# Patient Record
Sex: Female | Born: 1980 | Race: White | Hispanic: No | Marital: Married | Smoking: Never smoker
Health system: Southern US, Community
[De-identification: ages and names within clinical notes are randomized; demographics above are authoritative.]

## PROBLEM LIST (undated history)

## (undated) DIAGNOSIS — R112 Nausea with vomiting, unspecified: Secondary | ICD-10-CM

## (undated) DIAGNOSIS — M545 Low back pain, unspecified: Secondary | ICD-10-CM

## (undated) DIAGNOSIS — F32A Depression, unspecified: Secondary | ICD-10-CM

## (undated) DIAGNOSIS — E7800 Pure hypercholesterolemia, unspecified: Secondary | ICD-10-CM

## (undated) DIAGNOSIS — T8859XA Other complications of anesthesia, initial encounter: Secondary | ICD-10-CM

## (undated) DIAGNOSIS — I10 Essential (primary) hypertension: Secondary | ICD-10-CM

## (undated) DIAGNOSIS — R519 Headache, unspecified: Secondary | ICD-10-CM

## (undated) DIAGNOSIS — F419 Anxiety disorder, unspecified: Secondary | ICD-10-CM

## (undated) DIAGNOSIS — Z9889 Other specified postprocedural states: Secondary | ICD-10-CM

## (undated) DIAGNOSIS — K219 Gastro-esophageal reflux disease without esophagitis: Secondary | ICD-10-CM

## (undated) DIAGNOSIS — R300 Dysuria: Secondary | ICD-10-CM

## (undated) DIAGNOSIS — R079 Chest pain, unspecified: Secondary | ICD-10-CM

## (undated) HISTORY — DX: Low back pain: M54.5

## (undated) HISTORY — PX: CHOLECYSTECTOMY: SHX55

## (undated) HISTORY — DX: Low back pain, unspecified: M54.50

## (undated) HISTORY — DX: Dysuria: R30.0

## (undated) HISTORY — DX: Chest pain, unspecified: R07.9

## (undated) HISTORY — DX: Pure hypercholesterolemia, unspecified: E78.00

## (undated) HISTORY — DX: Gastro-esophageal reflux disease without esophagitis: K21.9

---

## 2008-02-07 ENCOUNTER — Ambulatory Visit: Payer: Self-pay | Admitting: Cardiology

## 2008-05-12 ENCOUNTER — Emergency Department (HOSPITAL_COMMUNITY): Admission: EM | Admit: 2008-05-12 | Discharge: 2008-05-12 | Payer: Self-pay | Admitting: Emergency Medicine

## 2017-08-18 ENCOUNTER — Encounter: Payer: Self-pay | Admitting: *Deleted

## 2017-08-19 ENCOUNTER — Encounter: Payer: Self-pay | Admitting: *Deleted

## 2017-08-19 ENCOUNTER — Encounter: Payer: Self-pay | Admitting: Cardiology

## 2017-08-19 ENCOUNTER — Ambulatory Visit (INDEPENDENT_AMBULATORY_CARE_PROVIDER_SITE_OTHER): Payer: Medicaid Other | Admitting: Cardiology

## 2017-08-19 ENCOUNTER — Telehealth: Payer: Self-pay | Admitting: Cardiology

## 2017-08-19 VITALS — BP 128/98 | HR 82 | Ht 60.0 in | Wt 249.8 lb

## 2017-08-19 DIAGNOSIS — R079 Chest pain, unspecified: Secondary | ICD-10-CM

## 2017-08-19 DIAGNOSIS — R9439 Abnormal result of other cardiovascular function study: Secondary | ICD-10-CM

## 2017-08-19 NOTE — Patient Instructions (Signed)
Your physician recommends that you schedule a follow-up appointment in: TO BE DETERMINED AFTER TESTING WITH DR  [facility]  Your physician recommends that you continue on your current medications as directed. Please refer to the Current Medication list given to you today.  Your physician has requested that you have en exercise stress myoview. For further information please visit [email or web address]. Please follow instruction sheet, as given.  Thank you for choosing [facility]!!

## 2017-08-19 NOTE — Progress Notes (Signed)
Clinical Summary Ms. Fifer is a 36 y.o.female seen as new consult, referred by [facility] for chest pain  1. Chest pain - left arm pain about 1 month. Midchest as well. Pressure pain midchest, 5/10 in severity. Occurs at rest or with exertion. Can get diaphoretic, headache. Lasts few seconds. Lasts 1-2 a day. Worst with deep breaths.  - walks 15-20 minutes, has had some recent DOE.Can have some palpitations at night.  - burninng pain constant in chest, can have some nausea  - 07/2017 GXT [facility]: exercised 5 minutes, 29mm ST depressions inferior leads in setting of hypertensive response with SBP to 215 (treadmill stopped by technician)  CAD risk factors: hyperlipidemia, HTN lifestyle controlled, father MI unsure of age    Past Medical History:  Diagnosis Date  . Chest pain   . Dysuria   . Low back pain      Allergies  Allergen Reactions  . Augmentin [Amoxicillin-Pot Clavulanate]     rash  . Cymbalta [Duloxetine Hcl]     Nausea   . Macrobid [Nitrofurantoin Macrocrystal]     Rash      Current Outpatient Prescriptions  Medication Sig Dispense Refill  . acetaminophen (TYLENOL) 500 MG tablet Take 500 mg by mouth every 6 (six) hours as needed.    . ALPRAZolam (XANAX) 0.5 MG tablet Take 0.5 mg by mouth 2 (two) times daily as needed for anxiety.    Marland Kitchen esomeprazole (NEXIUM) 40 MG capsule Take 40 mg by mouth daily at 12 noon.    Marland Kitchen ibuprofen (ADVIL,MOTRIN) 800 MG tablet Take 800 mg by mouth every 8 (eight) hours as needed.    . simvastatin (ZOCOR) 40 MG tablet Take 40 mg by mouth daily.     No current facility-administered medications for this visit.      No past surgical history on file.   Allergies  Allergen Reactions  . Augmentin [Amoxicillin-Pot Clavulanate]     rash  . Cymbalta [Duloxetine Hcl]     Nausea   . Macrobid [Nitrofurantoin Macrocrystal]     Rash       No family history on file.   Social History Ms. Eppolito reports that she has never  smoked. She has never used smokeless tobacco. Ms. Steichen has no alcohol history on file.   Review of Systems CONSTITUTIONAL: No weight loss, fever, chills, weakness or fatigue.  HEENT: Eyes: No visual loss, blurred vision, double vision or yellow sclerae.No hearing loss, sneezing, congestion, runny nose or sore throat.  SKIN: No rash or itching.  CARDIOVASCULAR: per hpi RESPIRATORY: per hpi GASTROINTESTINAL: No anorexia, nausea, vomiting or diarrhea. No abdominal pain or blood.  GENITOURINARY: No burning on urination, no polyuria NEUROLOGICAL: No headache, dizziness, syncope, paralysis, ataxia, numbness or tingling in the extremities. No change in bowel or bladder control.  MUSCULOSKELETAL: No muscle, back pain, joint pain or stiffness.  LYMPHATICS: No enlarged nodes. No history of splenectomy.  PSYCHIATRIC: No history of depression or anxiety.  ENDOCRINOLOGIC: No reports of sweating, cold or heat intolerance. No polyuria or polydipsia.  Marland Kitchen   Physical Examination Vitals:   08/19/17 1442  BP: (!) 128/98  Pulse: 82  SpO2: 98%   Vitals:   08/19/17 1442  Weight: 249 lb 12.8 oz (113.3 kg)  Height: 5' (1.524 m)    Gen: resting comfortably, no acute distress HEENT: no scleral icterus, pupils equal round and reactive, no palptable cervical adenopathy,  CV: RRR, no m/r/g, no jvd Resp: Clear to auscultation bilaterally  GI: abdomen is soft, non-tender, non-distended, normal bowel sounds, no hepatosplenomegaly MSK: extremities are warm, no edema.  Skin: warm, no rash Neuro:  no focal deficits Psych: appropriate affect    Assessment and Plan  1. Chest pain/Abnormal stress test - somewhat atypical symptoms. Recent GXT with ST depressions, unclear significance in setting of hypertensive response. We will obtain an exercise nucldear stress 2 day protocol to better evaluate and risk stratify   F/u pendings reults.       Arnoldo Lenis, M.D., F.A.C.C.

## 2017-08-19 NOTE — Telephone Encounter (Signed)
Pre-cert Verification for the following procedure   EXERCISE NUC STRESS TEST (2 DAY PROTOCOL) scheduled for 08-30-17

## 2017-08-30 ENCOUNTER — Encounter (HOSPITAL_COMMUNITY): Payer: Medicaid Other

## 2017-08-30 ENCOUNTER — Other Ambulatory Visit (HOSPITAL_COMMUNITY): Payer: Medicaid Other

## 2017-09-07 ENCOUNTER — Inpatient Hospital Stay (HOSPITAL_COMMUNITY): Admission: RE | Admit: 2017-09-07 | Payer: Medicaid Other | Source: Ambulatory Visit

## 2017-09-07 ENCOUNTER — Encounter (HOSPITAL_COMMUNITY): Payer: Medicaid Other

## 2017-09-08 ENCOUNTER — Encounter (HOSPITAL_COMMUNITY): Payer: Medicaid Other

## 2017-09-28 ENCOUNTER — Encounter (HOSPITAL_BASED_OUTPATIENT_CLINIC_OR_DEPARTMENT_OTHER)
Admission: RE | Admit: 2017-09-28 | Discharge: 2017-09-28 | Disposition: A | Discharge status: Home / Self Care | Payer: Medicaid Other | Source: Ambulatory Visit | Attending: Cardiology | Admitting: Cardiology

## 2017-09-28 ENCOUNTER — Encounter (HOSPITAL_COMMUNITY)
Admission: RE | Admit: 2017-09-28 | Discharge: 2017-09-28 | Disposition: A | Discharge status: Home / Self Care | Payer: Medicaid Other | Source: Ambulatory Visit | Attending: Cardiology | Admitting: Cardiology

## 2017-09-28 ENCOUNTER — Encounter (HOSPITAL_COMMUNITY): Payer: Self-pay

## 2017-09-28 ENCOUNTER — Encounter (HOSPITAL_COMMUNITY): Payer: Medicaid Other

## 2017-09-28 DIAGNOSIS — R079 Chest pain, unspecified: Secondary | ICD-10-CM

## 2017-09-28 HISTORY — DX: Essential (primary) hypertension: I10

## 2017-09-28 MED ORDER — TECHNETIUM TC 99M TETROFOSMIN IV KIT
30.0000 | PACK | Freq: Once | INTRAVENOUS | Status: AC | PRN
  Administered 2017-09-28: 32 via INTRAVENOUS

## 2017-09-28 MED ORDER — SODIUM CHLORIDE 0.9% FLUSH
INTRAVENOUS | Status: AC
  Administered 2017-09-28: 10 mL via INTRAVENOUS
  Filled 2017-09-28: qty 10

## 2017-09-28 MED ORDER — REGADENOSON 0.4 MG/5ML IV SOLN
INTRAVENOUS | Status: AC
  Filled 2017-09-28: qty 5

## 2017-09-29 ENCOUNTER — Encounter (HOSPITAL_COMMUNITY): Payer: Self-pay

## 2017-09-29 ENCOUNTER — Encounter (HOSPITAL_COMMUNITY)
Admission: RE | Admit: 2017-09-29 | Discharge: 2017-09-29 | Disposition: A | Discharge status: Home / Self Care | Payer: Medicaid Other | Source: Ambulatory Visit | Attending: Cardiology | Admitting: Cardiology

## 2017-09-29 LAB — NM MYOCAR MULTI W/SPECT W/WALL MOTION / EF
CHL CUP MPHR: 182 {beats}/min
CHL CUP NUCLEAR SDS: 2
CHL CUP RESTING HR STRESS: 74 {beats}/min
CHL RATE OF PERCEIVED EXERTION: 15
CSEPEDS: 42 s
Estimated workload: 7 METS
Exercise duration (min): 4 min
LHR: 0.43
LVDIAVOL: 61 mL (ref 46–106)
LVSYSVOL: 23 mL
NUC STRESS TID: 0.72
Peak HR: 164 {beats}/min
Percent HR: 90 %
SRS: 1
SSS: 3

## 2017-09-29 MED ORDER — TECHNETIUM TC 99M TETROFOSMIN IV KIT
25.0000 | PACK | Freq: Once | INTRAVENOUS | Status: AC | PRN
  Administered 2017-09-29: 26 via INTRAVENOUS

## 2017-09-30 ENCOUNTER — Telehealth: Payer: Self-pay | Admitting: *Deleted

## 2017-09-30 NOTE — Telephone Encounter (Signed)
-----   Message from Arnoldo Lenis, MD sent at 09/29/2017  4:04 PM EST ----- Stress test looks good, no evidence of any blockages. Should f/u with pcp to discuss noncardiac causes of symptoms. Can f/u with [name] in 4 months   Zandra Abts MD

## 2017-09-30 NOTE — Telephone Encounter (Signed)
Pt aware and voiced understanding - routed to pcp - 4 month recall placed

## 2017-10-29 ENCOUNTER — Encounter (INDEPENDENT_AMBULATORY_CARE_PROVIDER_SITE_OTHER): Payer: Self-pay | Admitting: Internal Medicine

## 2017-10-29 ENCOUNTER — Encounter (INDEPENDENT_AMBULATORY_CARE_PROVIDER_SITE_OTHER): Payer: Self-pay

## 2017-11-11 ENCOUNTER — Ambulatory Visit (INDEPENDENT_AMBULATORY_CARE_PROVIDER_SITE_OTHER): Payer: Medicaid Other | Admitting: Internal Medicine

## 2017-11-11 ENCOUNTER — Encounter (INDEPENDENT_AMBULATORY_CARE_PROVIDER_SITE_OTHER): Payer: Self-pay | Admitting: Internal Medicine

## 2018-12-04 IMAGING — NM NM MYOCAR MULTI W/SPECT W/WALL MOTION & EF
2 series · 12 of 12 positions shown · non-contrast
Comparison: none

[Series 1: stress gated · 6.51mm/px · 6 of 64 frames shown]
[frame 6/64]
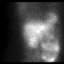
[frame 16/64]
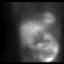
[frame 27/64]
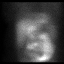
[frame 38/64]
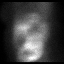
[frame 48/64]
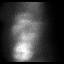
[frame 59/64]
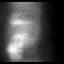

[Series 2: rest · 6.51mm/px · 6 of 64 frames shown]
[frame 6/64]
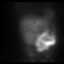
[frame 16/64]
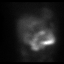
[frame 27/64]
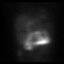
[frame 38/64]
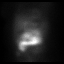
[frame 48/64]
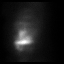
[frame 59/64]
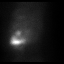

[12 of 12 positions shown; findings below may reference images not displayed]

Canned report from images found in remote index.

Refer to host system for actual result text.

## 2021-11-06 ENCOUNTER — Encounter: Payer: Medicaid Other | Admitting: Gynecologic Oncology

## 2021-11-14 DIAGNOSIS — Z6841 Body Mass Index (BMI) 40.0 and over, adult: Secondary | ICD-10-CM | POA: Diagnosis not present

## 2021-11-14 DIAGNOSIS — J019 Acute sinusitis, unspecified: Secondary | ICD-10-CM | POA: Diagnosis not present

## 2021-11-14 DIAGNOSIS — R350 Frequency of micturition: Secondary | ICD-10-CM | POA: Diagnosis not present

## 2021-11-14 DIAGNOSIS — Z20828 Contact with and (suspected) exposure to other viral communicable diseases: Secondary | ICD-10-CM | POA: Diagnosis not present

## 2021-11-17 DIAGNOSIS — J209 Acute bronchitis, unspecified: Secondary | ICD-10-CM | POA: Diagnosis not present

## 2021-11-17 DIAGNOSIS — J019 Acute sinusitis, unspecified: Secondary | ICD-10-CM | POA: Diagnosis not present

## 2021-11-17 DIAGNOSIS — N644 Mastodynia: Secondary | ICD-10-CM | POA: Diagnosis not present

## 2021-11-17 DIAGNOSIS — F419 Anxiety disorder, unspecified: Secondary | ICD-10-CM | POA: Diagnosis not present

## 2021-11-17 DIAGNOSIS — M545 Low back pain, unspecified: Secondary | ICD-10-CM | POA: Diagnosis not present

## 2021-11-17 DIAGNOSIS — R69 Illness, unspecified: Secondary | ICD-10-CM | POA: Diagnosis not present

## 2021-11-17 DIAGNOSIS — E7849 Other hyperlipidemia: Secondary | ICD-10-CM | POA: Diagnosis not present

## 2021-11-17 DIAGNOSIS — I10 Essential (primary) hypertension: Secondary | ICD-10-CM | POA: Diagnosis not present

## 2021-11-26 DIAGNOSIS — N644 Mastodynia: Secondary | ICD-10-CM | POA: Diagnosis not present

## 2021-11-26 DIAGNOSIS — R922 Inconclusive mammogram: Secondary | ICD-10-CM | POA: Diagnosis not present

## 2021-12-01 ENCOUNTER — Encounter: Payer: Self-pay | Admitting: Adult Health

## 2021-12-01 ENCOUNTER — Other Ambulatory Visit (HOSPITAL_COMMUNITY)
Admission: RE | Admit: 2021-12-01 | Discharge: 2021-12-01 | Disposition: A | Discharge status: Home / Self Care | Payer: 59 | Source: Ambulatory Visit | Attending: Adult Health | Admitting: Adult Health

## 2021-12-01 ENCOUNTER — Other Ambulatory Visit: Payer: Self-pay

## 2021-12-01 ENCOUNTER — Ambulatory Visit (INDEPENDENT_AMBULATORY_CARE_PROVIDER_SITE_OTHER): Payer: 59 | Admitting: Adult Health

## 2021-12-01 VITALS — BP 155/108 | HR 67 | Ht 60.0 in | Wt 264.0 lb

## 2021-12-01 DIAGNOSIS — N921 Excessive and frequent menstruation with irregular cycle: Secondary | ICD-10-CM

## 2021-12-01 DIAGNOSIS — R232 Flushing: Secondary | ICD-10-CM

## 2021-12-01 DIAGNOSIS — Z124 Encounter for screening for malignant neoplasm of cervix: Secondary | ICD-10-CM

## 2021-12-01 DIAGNOSIS — R4589 Other symptoms and signs involving emotional state: Secondary | ICD-10-CM | POA: Diagnosis not present

## 2021-12-01 DIAGNOSIS — R69 Illness, unspecified: Secondary | ICD-10-CM | POA: Diagnosis not present

## 2021-12-01 DIAGNOSIS — N946 Dysmenorrhea, unspecified: Secondary | ICD-10-CM

## 2021-12-01 MED ORDER — MEGESTROL ACETATE 40 MG PO TABS: ORAL_TABLET | ORAL | 1 refills | Status: DC

## 2021-12-01 NOTE — Progress Notes (Signed)
Patient ID: Michele Wu, female   DOB: 1981/03/10, 41 y.o.   MRN: [id number] History of Present Illness: Michele Wu is a 41 year old white female,married, [id number], in complaining of heavy bleeding for 1 week, and had period 2 weeks in December, but skipped October and November. She has had clots and cramps. She needs pap. She is a new pt. PCP is Dr Quillian Quince.   Current Medications, Allergies, Past Medical History, Past Surgical History, Family History and Social History were reviewed in [employer] record.     Review of Systems: Heavy bleeding x 1 week Had period 2 weeks in December, but skipped October and November Reviewed past medical,surgical, social and family history. Reviewed medications and allergies.     Physical Exam:BP (!) 155/108 (BP Location: Right Arm, Patient Position: Sitting, Cuff Size: Normal)    Pulse 67    Ht 5' (1.524 m)    Wt 264 lb (119.7 kg)    LMP 11/24/2021    BMI 51.56 kg/m   General:  Well developed, well nourished, no acute distress Skin:  Warm and dry Neck:  Midline trachea, normal thyroid, good ROM, no lymphadenopathy Lungs; Clear to auscultation bilaterally Cardiovascular: Regular rate and rhythm Pelvic:  External genitalia is normal in appearance, no lesions.  The vagina is normal in appearance.+blood. Urethra has no lesions or masses. The cervix is bulbous.Pap with HR HPV genotyping performed.  Uterus is felt to be slightly enlarged.  No adnexal masses or tenderness noted.Bladder is non tender, no masses felt. Extremities/musculoskeletal:  No swelling or varicosities noted, no clubbing or cyanosis Psych:  No mood changes, alert and cooperative,seems happy AA is 0 Fall risk is low Depression screen PHQ 2/9 12/01/2021  Decreased Interest 0  Down, Depressed, Hopeless 0  PHQ - 2 Score 0    GAD 7 : Generalized Anxiety Score 12/01/2021  Nervous, Anxious, on Edge 3  Control/stop worrying 3  Worry too much - different things 3   Trouble relaxing 3  Restless 3  Easily annoyed or irritable 3  Afraid - awful might happen 3  Total GAD 7 Score 21  Takes xanax as needed for anxiety per PCP   Upstream - 12/01/21 0954       Pregnancy Intention Screening   Does the patient want to become pregnant in the next year? No    Does the patient's partner want to become pregnant in the next year? No    Would the patient like to discuss contraceptive options today? No      Contraception Wrap Up   Current Method Female Sterilization    End Method Female Sterilization    Contraception Counseling Provided No            Examination chaperoned by Celene Squibb LPN    Impression and Plan: 1. Menorrhagia with irregular cycle Will get pelvic US 12/04/21 at [facility] at 12:30 pm to assess uterus and ovaries Will check labs  Will rx megace to try to stop bleeding Meds ordered this encounter  Medications   megestrol (MEGACE) 40 MG tablet    Sig: Take 3 x 5 days when 2 x 5 days then 1 daily    Dispense:  45 tablet    Refill:  1    Order Specific Question:   Supervising Provider    Answer:   Tania Ade H [[id number]]    - US PELVIC COMPLETE WITH TRANSVAGINAL; Future - CBC - TSH - Follicle stimulating hormone Follow up with  me in 1 week to review [name] and ROS  2. Dysmenorrhea - US PELVIC COMPLETE WITH TRANSVAGINAL; Future  3. Moody  4. Hot flashes Will check labs  - Comprehensive metabolic panel - TSH - Follicle stimulating hormone Will call her with labs results   5. Routine Papanicolaou smear Pap sent HR HPV genotyping sent  - Cytology - PAP( [place])   To to [facility] about [facility]

## 2021-12-02 LAB — COMPREHENSIVE METABOLIC PANEL
ALT: 25 IU/L (ref 0–32)
AST: 22 IU/L (ref 0–40)
Albumin/Globulin Ratio: 1.4 (ref 1.2–2.2)
Albumin: 4.4 g/dL (ref 3.8–4.8)
Alkaline Phosphatase: 89 IU/L (ref 44–121)
BUN/Creatinine Ratio: 21 (ref 9–23)
BUN: 18 mg/dL (ref 6–24)
Bilirubin Total: 0.3 mg/dL (ref 0.0–1.2)
CO2: 23 mmol/L (ref 20–29)
Calcium: 9.3 mg/dL (ref 8.7–10.2)
Chloride: 104 mmol/L (ref 96–106)
Creatinine, Ser: 0.84 mg/dL (ref 0.57–1.00)
Globulin, Total: 3.1 g/dL (ref 1.5–4.5)
Glucose: 96 mg/dL (ref 70–99)
Potassium: 4.5 mmol/L (ref 3.5–5.2)
Sodium: 141 mmol/L (ref 134–144)
Total Protein: 7.5 g/dL (ref 6.0–8.5)
eGFR: 89 mL/min/{1.73_m2} (ref >59)

## 2021-12-02 LAB — CBC
Hematocrit: 39.5 % (ref 34.0–46.6)
Hemoglobin: 13.3 g/dL (ref 11.1–15.9)
MCH: 30.6 pg (ref 26.6–33.0)
MCHC: 33.7 g/dL (ref 31.5–35.7)
MCV: 91 fL (ref 79–97)
Platelets: 325 10*3/uL (ref 150–450)
RBC: 4.34 x10E6/uL (ref 3.77–5.28)
RDW: 13 % (ref 11.7–15.4)
WBC: 8 10*3/uL (ref 3.4–10.8)

## 2021-12-02 LAB — TSH: TSH: 2.1 u[IU]/mL (ref 0.450–4.500)

## 2021-12-02 LAB — FOLLICLE STIMULATING HORMONE: FSH: 11.6 m[IU]/mL

## 2021-12-03 LAB — CYTOLOGY - PAP
Comment: NEGATIVE
High risk HPV: NEGATIVE

## 2021-12-05 ENCOUNTER — Ambulatory Visit (HOSPITAL_COMMUNITY)
Admission: RE | Admit: 2021-12-05 | Discharge: 2021-12-05 | Disposition: A | Discharge status: Home / Self Care | Payer: 59 | Source: Ambulatory Visit | Attending: Adult Health | Admitting: Adult Health

## 2021-12-05 ENCOUNTER — Other Ambulatory Visit: Payer: Self-pay

## 2021-12-05 DIAGNOSIS — N921 Excessive and frequent menstruation with irregular cycle: Secondary | ICD-10-CM | POA: Insufficient documentation

## 2021-12-05 DIAGNOSIS — N946 Dysmenorrhea, unspecified: Secondary | ICD-10-CM | POA: Diagnosis present

## 2021-12-08 ENCOUNTER — Ambulatory Visit (INDEPENDENT_AMBULATORY_CARE_PROVIDER_SITE_OTHER): Payer: 59 | Admitting: Adult Health

## 2021-12-08 ENCOUNTER — Encounter: Payer: Self-pay | Admitting: Adult Health

## 2021-12-08 ENCOUNTER — Other Ambulatory Visit: Payer: Self-pay

## 2021-12-08 VITALS — BP 151/102 | HR 77 | Ht 61.0 in | Wt 267.0 lb

## 2021-12-08 DIAGNOSIS — R1031 Right lower quadrant pain: Secondary | ICD-10-CM

## 2021-12-08 DIAGNOSIS — N83202 Unspecified ovarian cyst, left side: Secondary | ICD-10-CM

## 2021-12-08 DIAGNOSIS — N83291 Other ovarian cyst, right side: Secondary | ICD-10-CM | POA: Diagnosis not present

## 2021-12-08 DIAGNOSIS — N921 Excessive and frequent menstruation with irregular cycle: Secondary | ICD-10-CM

## 2021-12-08 MED ORDER — MEGESTROL ACETATE 40 MG PO TABS: ORAL_TABLET | ORAL | 3 refills | Status: DC

## 2021-12-08 NOTE — Progress Notes (Signed)
°  Subjective:     Patient ID: Michele Wu, female   DOB: August 23, 1981, 41 y.o.   MRN: [id number]  HPI Michele Wu is a 41 year old white female, married, [id number], back in follow up on taking megace to stop heavy bleeding and having pelvic US and labs. Bleeding has stopped, still has pain in right side, labs all normal.US showed: .Unremarkable uterus and endometrial complex.   Complicated cyst of the LEFT ovary 3.3 cm greatest size containing multiple thin septations; surgical evaluation recommended.   In addition, complicated cyst in the RIGHT adnexa either of ovarian or para-ovarian origin, 4.2 cm greatest size and containing an avascular mural nodule; either MR imaging with and without contrast or surgical evaluation recommended.  Lab Results  Component Value Date   DIAGPAP - Atypical glandular cells, NOS (A) 12/01/2021   HPVHIGH Negative 12/01/2021    PCP is Dr Quillian Quince.  Review of Systems Bleeding has stopped Has pain in right side Reviewed past medical,surgical, social and family history. Reviewed medications and allergies.     Objective:   Physical Exam BP (!) 151/102 (BP Location: Right Arm, Patient Position: Sitting, Cuff Size: Large)    Pulse 77    Ht 5\' 1"  (1.549 m)    Wt 267 lb (121.1 kg)    LMP 11/24/2021    BMI 50.45 kg/m   was bad with phone company this morning, did take meds.for BP.   Skin warm and dry.Lungs: clear to ausculation bilaterally. Cardiovascular: regular rate and rhythm.   Upstream - 12/08/21 1110       Pregnancy Intention Screening   Does the patient want to become pregnant in the next year? No    Does the patient's partner want to become pregnant in the next year? No    Would the patient like to discuss contraceptive options today? No      Contraception Wrap Up   Current Method Female Sterilization    End Method Female Sterilization    Contraception Counseling Provided No             Assessment:     1. Menorrhagia with irregular  cycle Will continue megace 1 daily Meds ordered this encounter  Medications   megestrol (MEGACE) 40 MG tablet    Sig: Take 1 daily    Dispense:  30 tablet    Refill:  3    Order Specific Question:   Supervising Provider    Answer:   Elonda Husky, LUTHER H [[id number]]     2. RLQ abdominal pain Will get appt with Dr  Nelda Marseille to discuss   3. Cyst of left ovary Can follow for now  4. Complex cyst of right ovary Will get appt with Dr Nelda Marseille for possible surgical removal     Plan:     Will get appt with Dr Nelda Marseille about 12/19/21 to discuss options  Will repeat pap in 1 year, was bleeding when performed.

## 2021-12-19 ENCOUNTER — Ambulatory Visit: Payer: 59 | Admitting: Obstetrics & Gynecology

## 2021-12-26 DIAGNOSIS — N390 Urinary tract infection, site not specified: Secondary | ICD-10-CM | POA: Diagnosis not present

## 2021-12-26 DIAGNOSIS — R300 Dysuria: Secondary | ICD-10-CM | POA: Diagnosis not present

## 2022-01-14 ENCOUNTER — Ambulatory Visit (INDEPENDENT_AMBULATORY_CARE_PROVIDER_SITE_OTHER): Payer: 59 | Admitting: Obstetrics & Gynecology

## 2022-01-14 ENCOUNTER — Encounter: Payer: Self-pay | Admitting: Obstetrics & Gynecology

## 2022-01-14 ENCOUNTER — Other Ambulatory Visit: Payer: Self-pay

## 2022-01-14 VITALS — BP 156/99 | HR 73 | Ht 60.0 in | Wt 269.0 lb

## 2022-01-14 DIAGNOSIS — Z98891 History of uterine scar from previous surgery: Secondary | ICD-10-CM | POA: Diagnosis not present

## 2022-01-14 DIAGNOSIS — I10 Essential (primary) hypertension: Secondary | ICD-10-CM

## 2022-01-14 DIAGNOSIS — Z6841 Body Mass Index (BMI) 40.0 and over, adult: Secondary | ICD-10-CM

## 2022-01-14 DIAGNOSIS — N939 Abnormal uterine and vaginal bleeding, unspecified: Secondary | ICD-10-CM | POA: Diagnosis not present

## 2022-01-14 MED ORDER — MEGESTROL ACETATE 40 MG PO TABS: ORAL_TABLET | ORAL | 3 refills | Status: DC

## 2022-01-14 NOTE — Progress Notes (Signed)
? ?  GYN VISIT ?Patient name: Michele Wu MRN [id number]  Date of birth: [birth year] ?Chief Complaint:   ?Follow-up ? ?History of Present Illness:   ?Michele Wu is a 41 y.o. [phone]  female being seen today for the following concerns: ? ?AUB: Issues with menses since December.  Sometimes she would skip several months, other times monthly. Menses have also lasted longer- now about 2 weeks.  Now using a pull-up due to the heaviness about 3-4x per day.  Starting on Megace- took 3 x 5 days then 2 x 5 days then daily.  Currently on daily and that is not working,she states she has been bleeding x 2weeks.  Previously when on Megace 2 or 3 tabs the bleeding did stop. ? ?[name]- 12/05/21: 7.9cm uterus, 1.2IN left complicated cyst with thin septation.  Right ovary with 4.2cm para-ovarian cyst with mural nodule.    ? ?She has noted intermittent pelvic pain.  Denies acute pain currently. ? ?Patient's last menstrual period was 01/05/2022 (exact date). ? ? ?  12/01/2021  ?  9:53 AM  ?Depression screen PHQ 2/9  ?Decreased Interest 0  ?Down, Depressed, Hopeless 0  ?PHQ - 2 Score 0  ? ? ? ?Review of Systems:   ?Pertinent items are noted in HPI ?Denies fever/chills, dizziness, headaches, visual disturbances, fatigue, shortness of breath, chest pain, abdominal pain, vomiting,denies problems with bowel movements, urination, or intercourse unless otherwise stated above.  ?Pertinent History Reviewed:  ?Reviewed past medical,surgical, social, obstetrical and family history.  ?Reviewed problem list, medications and allergies. ?Physical Assessment:  ? ?Vitals:  ? 01/14/22 1523  ?BP: (!) 156/99  ?Pulse: 73  ?Weight: 269 lb (122 kg)  ?Height: 5' (1.524 m)  ?Body mass index is 52.54 kg/m?. ? ?     Physical Examination:  ? General appearance: alert, well appearing, and in no distress ? Psych: mood appropriate, normal affect ? Skin: warm & dry  ? Cardiovascular: normal heart rate noted ? Respiratory: normal respiratory effort, no  distress ? Abdomen: obese, soft, non-tender, uterus below umbilicus ? Pelvic: VULVA: normal appearing vulva with no masses, tenderness or lesions, VAGINA: normal appearing vagina with normal color and discharge, no lesions, CERVIX: normal appearing cervix without discharge or lesions- no bleeding appreciated UTERUS: uterus is normal size, shape, consistency and nontender- minimal mobility with tenaculum- difficult to evaluate on bimanual exam ? Extremities: no calf tenderness bilaterally ? ?Chaperone: Glenard Haring Neas   ? ?Assessment & Plan:  ?1) Abnormal uterine bleeding ?-due to cHTN, pt not a candidate for estrogen therapy ?-reviewed management options from medical to surgical intervention ?-pt sick of bleeding and would prefer surgical intervention- leaning towards hysterectomy ?-reviewed routes of hysterectomy- pending availability may consider LAVH in [place] or potential mini-lap hysterectomy ?-reviewed risk/benefits including but not limited to risk of bleeding, infection and injury to surrounding organs. Questions and concerns were addressed ? ?2) Ovarian cyst ?-discussed US findings, overall suspect benign appearance ?-plan for repeat US in 6wks for re-evaluation ?-reviewed potential for cystectomy or possible oophorectomy at the time of hysterectomy ?-currently pt asymptomatic ? ?Meds ordered this encounter  ?Medications  ? megestrol (MEGACE) 40 MG tablet  ?  Sig: Take 2 tablets daily  ?  Dispense:  60 tablet  ?  Refill:  3  ?  ? ? ?Return for pelvic US mid April (our office). ? ? ?Janyth Pupa, DO ?Attending [place], Faculty Practice ?[facility] for [place] ? ? ? ?

## 2022-01-26 DIAGNOSIS — L0291 Cutaneous abscess, unspecified: Secondary | ICD-10-CM | POA: Diagnosis not present

## 2022-01-26 DIAGNOSIS — I10 Essential (primary) hypertension: Secondary | ICD-10-CM | POA: Diagnosis not present

## 2022-01-26 DIAGNOSIS — Z6841 Body Mass Index (BMI) 40.0 and over, adult: Secondary | ICD-10-CM | POA: Diagnosis not present

## 2022-01-26 DIAGNOSIS — R5383 Other fatigue: Secondary | ICD-10-CM | POA: Diagnosis not present

## 2022-01-26 DIAGNOSIS — R300 Dysuria: Secondary | ICD-10-CM | POA: Diagnosis not present

## 2022-01-26 DIAGNOSIS — L0390 Cellulitis, unspecified: Secondary | ICD-10-CM | POA: Diagnosis not present

## 2022-01-26 DIAGNOSIS — E785 Hyperlipidemia, unspecified: Secondary | ICD-10-CM | POA: Diagnosis not present

## 2022-01-29 DIAGNOSIS — L0291 Cutaneous abscess, unspecified: Secondary | ICD-10-CM | POA: Diagnosis not present

## 2022-01-29 DIAGNOSIS — L0390 Cellulitis, unspecified: Secondary | ICD-10-CM | POA: Diagnosis not present

## 2022-01-29 DIAGNOSIS — Z6841 Body Mass Index (BMI) 40.0 and over, adult: Secondary | ICD-10-CM | POA: Diagnosis not present

## 2022-02-05 ENCOUNTER — Telehealth: Payer: Self-pay | Admitting: Obstetrics & Gynecology

## 2022-02-05 NOTE — Telephone Encounter (Signed)
Called patient to discuss next step regarding surgical intervention ? ?

## 2022-02-06 ENCOUNTER — Other Ambulatory Visit: Payer: Self-pay | Admitting: Obstetrics & Gynecology

## 2022-02-06 DIAGNOSIS — N83201 Unspecified ovarian cyst, right side: Secondary | ICD-10-CM

## 2022-02-09 ENCOUNTER — Ambulatory Visit (INDEPENDENT_AMBULATORY_CARE_PROVIDER_SITE_OTHER): Payer: 59

## 2022-02-09 DIAGNOSIS — N83202 Unspecified ovarian cyst, left side: Secondary | ICD-10-CM | POA: Diagnosis not present

## 2022-02-09 DIAGNOSIS — N83201 Unspecified ovarian cyst, right side: Secondary | ICD-10-CM | POA: Diagnosis not present

## 2022-02-09 NOTE — Progress Notes (Signed)
PELVIC US TA/TV: heterogeneous anteverted uterus with multiple fibroids and linear striations (#1) posterior fundal right intramural fibroid 2.6 x 2.5 x 1.9 cm,(#2) posterior mid submucosal vs intramural fibroid 2.5 x 1.6 x 1.7 cm,EEC 4.1 mm,endometrium appears slightly distorted by the posterior mid fibroid,septated complex left ovarian cyst 2.3 x 1.8 x 1.1,normal right ovary,4.1 x 3.1 x 3.8  complex right adnexal cyst with a nonvascular nodule 1 x .6 cm,no free fluid,pelvic pain during ultrasound,ovaries appear mobile  ? ?Chaperone Estill Bamberg ?

## 2022-02-13 ENCOUNTER — Other Ambulatory Visit: Payer: 59

## 2022-02-13 ENCOUNTER — Ambulatory Visit: Payer: 59 | Admitting: Obstetrics & Gynecology

## 2022-02-16 DIAGNOSIS — K219 Gastro-esophageal reflux disease without esophagitis: Secondary | ICD-10-CM | POA: Diagnosis not present

## 2022-02-16 DIAGNOSIS — R69 Illness, unspecified: Secondary | ICD-10-CM | POA: Diagnosis not present

## 2022-02-16 DIAGNOSIS — R350 Frequency of micturition: Secondary | ICD-10-CM | POA: Diagnosis not present

## 2022-02-16 DIAGNOSIS — M545 Low back pain, unspecified: Secondary | ICD-10-CM | POA: Diagnosis not present

## 2022-02-16 DIAGNOSIS — Z6841 Body Mass Index (BMI) 40.0 and over, adult: Secondary | ICD-10-CM | POA: Diagnosis not present

## 2022-02-16 DIAGNOSIS — E7849 Other hyperlipidemia: Secondary | ICD-10-CM | POA: Diagnosis not present

## 2022-02-16 DIAGNOSIS — F419 Anxiety disorder, unspecified: Secondary | ICD-10-CM | POA: Diagnosis not present

## 2022-02-16 DIAGNOSIS — I10 Essential (primary) hypertension: Secondary | ICD-10-CM | POA: Diagnosis not present

## 2022-02-23 ENCOUNTER — Other Ambulatory Visit: Payer: 59

## 2022-02-23 ENCOUNTER — Encounter: Payer: Self-pay | Admitting: Obstetrics & Gynecology

## 2022-02-23 ENCOUNTER — Ambulatory Visit (INDEPENDENT_AMBULATORY_CARE_PROVIDER_SITE_OTHER): Payer: 59 | Admitting: Obstetrics & Gynecology

## 2022-02-23 VITALS — BP 157/95 | HR 66 | Ht 60.0 in | Wt 270.0 lb

## 2022-02-23 DIAGNOSIS — N921 Excessive and frequent menstruation with irregular cycle: Secondary | ICD-10-CM

## 2022-02-23 DIAGNOSIS — Z98891 History of uterine scar from previous surgery: Secondary | ICD-10-CM | POA: Diagnosis not present

## 2022-02-23 DIAGNOSIS — Z6841 Body Mass Index (BMI) 40.0 and over, adult: Secondary | ICD-10-CM

## 2022-02-23 DIAGNOSIS — N83291 Other ovarian cyst, right side: Secondary | ICD-10-CM | POA: Diagnosis not present

## 2022-02-23 DIAGNOSIS — N83202 Unspecified ovarian cyst, left side: Secondary | ICD-10-CM | POA: Diagnosis not present

## 2022-02-23 DIAGNOSIS — N83201 Unspecified ovarian cyst, right side: Secondary | ICD-10-CM | POA: Diagnosis not present

## 2022-02-23 NOTE — Progress Notes (Signed)
? ?GYN VISIT ?Patient name: Michele Wu MRN [id number]  Date of birth: [birth year] ?Chief Complaint:   ?Pre-op Exam ? ?History of Present Illness:   ?Michele Wu is a 41 y.o. G61P2012  female being seen today for follow up regarding recent US:    ? ?In review, pt has been struggling with AUB and lower pelvic pain.  As part of her work up, a pelvic US was completed.   ? ?Constant lower pelvic pain that feels like a UTI; however, UA sample negative.  This has been an ongoing issue for over a year.  Discussed with patient that pain may or may not be gynecologic in nature. ? ?Prior C-section x 2 ? ?AUB: In review, she has had issues with menses since December.  Sometimes she would skip several months, other times monthly. Menses have also lasted longer- sometimes about 2 weeks.  Requried a pull-up due to the heaviness about 3-4x per day.  Currently taking 3 tabs of Megace with resolution of her bleeding.  She has tried to stop; however, bleeding has returned.  Plans to continue with Megace until surgery can be scheduled. ? ?[name] report: ? ?02/09/22: 5.8cm uterus with multiple fibroids and linear striations (#1) posterior fundal right intramural fibroid 2.6 x 2.5 x 1.9 cm,(#2) posterior mid submucosal vs intramural fibroid 2.5 x 1.6 x 1.7 cm ? ?Ovaries: ?septated complex left ovarian cyst 2.3 x 1.8 x 1.1,normal right ovary,4.1 x 3.1 x 3.8  complex right adnexal cyst with a nonvascular nodule 1 x .6 cm,no free fluid,pelvic pain during ultrasound,ovaries appear mobile  ? ?[name]- 12/05/21: 7.9cm uterus, 3.3LK left complicated cyst with thin septation.  Right ovary with 4.2cm para-ovarian cyst with mural nodule.    ? ?No LMP recorded. Patient is perimenopausal. ? ? ?  12/01/2021  ?  9:53 AM  ?Depression screen PHQ 2/9  ?Decreased Interest 0  ?Down, Depressed, Hopeless 0  ?PHQ - 2 Score 0  ? ? ? ?Review of Systems:   ?Pertinent items are noted in HPI ?Denies fever/chills, dizziness, headaches, visual disturbances, fatigue,  shortness of breath, chest pain, vomiting unless otherwise stated above.  ?Pertinent History Reviewed:  ?Reviewed past medical,surgical, social, obstetrical and family history.  ?OBhx: C-section x 2 ?Reviewed problem list, medications and allergies. ?Physical Assessment:  ? ?Vitals:  ? 02/23/22 1518  ?BP: (!) 157/95  ?Pulse: 66  ?Weight: 270 lb (122.5 kg)  ?Height: 5' (1.524 m)  ?Body mass index is 52.73 kg/m?. ? ?     Physical Examination:  ? General appearance: alert, well appearing, and in no distress ? Psych: mood appropriate, normal affect ? Skin: warm & dry  ? Cardiovascular: normal heart rate noted ? Respiratory: normal respiratory effort, no distress ? Abdomen: obese, soft, +RLQ tenderness ? Pelvic: examination not indicated ? Extremities: no edema  ? ?Chaperone: N/A   ? ?Assessment & Plan:  ?1) Bilateral ovarian cyst ?-reviewed [name] results ?-discussed plan for ROMA testing today to discuss next surgical step ?-due to prior c-sections, vaginal surgery not ideal ?-If ROMA low risk- would plan for total abdominal hysterectomy, bilateral salpingectomy and right cyst removal possible oophorectomy ?-discussed surgical management and ideally plan for ovarian preservation if possible ?-Reviewed risk/benefits and will plan to schedule at next available ?-Questions and concerns were addressed ?-Plan to call patient once ROMA results have returned ? ?2) AUB ?-for now plan to continue with Megace until surgical intervention ? ? ?Orders Placed This Encounter  ?Procedures  ? Ovarian Malignancy  Risk-ROMA  ? ? ?Return for to be determined. ? ? ?Janyth Pupa, DO ?Attending [place], Faculty Practice ?[facility] [place] ? ? ? ?

## 2022-02-24 LAB — OVARIAN MALIGNANCY RISK-ROMA
Cancer Antigen (CA) 125: 30.7 U/mL (ref 0.0–38.1)
HE4: 53.4 pmol/L (ref 0.0–63.6)
Postmenopausal ROMA: 1.91
Premenopausal ROMA: 0.9

## 2022-02-24 LAB — PREMENOPAUSAL INTERP: LOW

## 2022-02-24 LAB — POSTMENOPAUSAL INTERP: LOW

## 2022-03-03 DIAGNOSIS — Z20828 Contact with and (suspected) exposure to other viral communicable diseases: Secondary | ICD-10-CM | POA: Diagnosis not present

## 2022-03-03 DIAGNOSIS — J019 Acute sinusitis, unspecified: Secondary | ICD-10-CM | POA: Diagnosis not present

## 2022-03-03 DIAGNOSIS — R059 Cough, unspecified: Secondary | ICD-10-CM | POA: Diagnosis not present

## 2022-03-03 DIAGNOSIS — I10 Essential (primary) hypertension: Secondary | ICD-10-CM | POA: Diagnosis not present

## 2022-03-26 ENCOUNTER — Encounter: Payer: Self-pay | Admitting: Obstetrics & Gynecology

## 2022-04-01 ENCOUNTER — Ambulatory Visit (INDEPENDENT_AMBULATORY_CARE_PROVIDER_SITE_OTHER): Payer: 59 | Admitting: Obstetrics & Gynecology

## 2022-04-01 ENCOUNTER — Encounter: Payer: Self-pay | Admitting: Obstetrics & Gynecology

## 2022-04-01 VITALS — BP 170/115 | HR 90 | Ht 60.0 in | Wt 274.2 lb

## 2022-04-01 DIAGNOSIS — L304 Erythema intertrigo: Secondary | ICD-10-CM | POA: Diagnosis not present

## 2022-04-01 DIAGNOSIS — Z6841 Body Mass Index (BMI) 40.0 and over, adult: Secondary | ICD-10-CM

## 2022-04-01 DIAGNOSIS — R102 Pelvic and perineal pain: Secondary | ICD-10-CM | POA: Diagnosis not present

## 2022-04-01 DIAGNOSIS — N83291 Other ovarian cyst, right side: Secondary | ICD-10-CM

## 2022-04-01 DIAGNOSIS — N921 Excessive and frequent menstruation with irregular cycle: Secondary | ICD-10-CM

## 2022-04-01 DIAGNOSIS — N83202 Unspecified ovarian cyst, left side: Secondary | ICD-10-CM | POA: Diagnosis not present

## 2022-04-01 DIAGNOSIS — N83201 Unspecified ovarian cyst, right side: Secondary | ICD-10-CM | POA: Diagnosis not present

## 2022-04-01 DIAGNOSIS — I10 Essential (primary) hypertension: Secondary | ICD-10-CM

## 2022-04-01 MED ORDER — CLOTRIMAZOLE-BETAMETHASONE 1-0.05 % EX CREA: TOPICAL_CREAM | CUTANEOUS | 1 refills | Status: DC

## 2022-04-01 NOTE — Progress Notes (Signed)
   GYN VISIT Patient name: Michele Wu MRN [id number]  Date of birth: 18-Sep-1981 Chief Complaint:   Pre-op Exam  History of Present Illness:   Michele Wu is a 41 y.o. [phone]  female being seen today for preop exam due to upcoming TAH, BS and right ovarian cystectomy, possible oophorectomy  scheduled for 04/22/22.     Despite being on Megace, she noted bleeding 5/16 that lasted for the entire month, using about 4 pads per day.  Bleeding finally stopped this month. She continues to take the Megace daily. She also notes issues with pelvic pain not only during her period, but likely associated with ovarian cyst.  Rates her pain 7/10, no improvement with  In review, she has struggled with worsening pelvic pain.  Pain is still present, rates her pain 7/10.  Taking OTC medication with no to minimal improvement. At this time, she desires to proceed with surgical intervention.  No LMP recorded. Patient is perimenopausal.     12/01/2021    9:53 AM  Depression screen PHQ 2/9  Decreased Interest 0  Down, Depressed, Hopeless 0  PHQ - 2 Score 0     Review of Systems:   Pertinent items are noted in HPI Denies fever/chills, dizziness, headaches, visual disturbances, fatigue, shortness of breath, chest pain, abdominal pain, vomiting.  Denies urinary or bowel concerns. Pertinent History Reviewed:  Reviewed past medical,surgical, social, obstetrical and family history.  Reviewed problem list, medications and allergies. Physical Assessment:   Vitals:   04/01/22 1514 04/01/22 1516  BP: (!) 161/111 (!) 170/115  Pulse: 75 90  Weight: 274 lb 3.2 oz (124.4 kg)   Height: 5' (1.524 m)   Body mass index is 53.55 kg/m.       Physical Examination:   General appearance: alert, well appearing, and in no distress  Psych: mood appropriate, normal affect  Skin: warm & dry   Cardiovascular: normal heart rate noted  Respiratory: normal respiratory effort, no distress  Abdomen: obese, soft,  non-tender, under pannus- shiny pink patches extending to groin  Pelvic: examination not indicated  Extremities: no edema   Chaperone: N/A    Assessment & Plan:  1) AUB, bilateral ovarian cyst, pelvic pain -plan to proceed with total abdominal hysterectomy, bilateral salpingectomy, right cystectomy and possible oophorectomy -reviewed risk/benefit including but not limited to risk of bleeding, infection and injury to surrounding organs -discussed ovarian preservation if possible -discussed pre and postop expectations -pt desires overnight hospitalization -Reviewed postop recovery and time off work -Questions and concerns were addressed  2) Elevated BP with chronic HTN -pt states she did not take her meds today -return later this week for BP check -discussed importance of BP control prior to surgery -if elevated will increase Toprol and review with PCP  3) Intertrigo -Rx sent in for treatment prior to surgery   Meds ordered this encounter  Medications   clotrimazole-betamethasone (LOTRISONE) cream    Sig: Apply small amount to affected area twice daily as needed    Dispense:  30 g    Refill:  [place], DO Attending [place], [place] for [employer], [place]

## 2022-04-02 DIAGNOSIS — I10 Essential (primary) hypertension: Secondary | ICD-10-CM | POA: Diagnosis not present

## 2022-04-02 DIAGNOSIS — R5383 Other fatigue: Secondary | ICD-10-CM | POA: Diagnosis not present

## 2022-04-02 DIAGNOSIS — R0683 Snoring: Secondary | ICD-10-CM | POA: Diagnosis not present

## 2022-04-02 DIAGNOSIS — Z6841 Body Mass Index (BMI) 40.0 and over, adult: Secondary | ICD-10-CM | POA: Diagnosis not present

## 2022-04-06 ENCOUNTER — Encounter: Payer: Self-pay | Admitting: Obstetrics & Gynecology

## 2022-04-10 ENCOUNTER — Ambulatory Visit (INDEPENDENT_AMBULATORY_CARE_PROVIDER_SITE_OTHER): Payer: 59 | Admitting: *Deleted

## 2022-04-10 VITALS — BP 145/107 | HR 68

## 2022-04-10 DIAGNOSIS — I10 Essential (primary) hypertension: Secondary | ICD-10-CM | POA: Diagnosis not present

## 2022-04-10 DIAGNOSIS — Z0130 Encounter for examination of blood pressure without abnormal findings: Secondary | ICD-10-CM | POA: Diagnosis not present

## 2022-04-10 NOTE — Progress Notes (Signed)
   NURSE VISIT- BLOOD PRESSURE CHECK  SUBJECTIVE:  Michele Wu is a 41 y.o. G38P2012 female here for BP check. She is a GYN patient.  Went to PCP a few days after visit with [name] and was started on Losartan but has not started taking the medication yet.   HYPERTENSION ROS:  GYN patient: Taking medicines as instructed no Headaches  No Chest pain No Shortness of breath No Swelling in legs/ankles No  OBJECTIVE:  BP (!) 145/107 (BP Location: Right Arm, Patient Position: Sitting, Cuff Size: Large)   Pulse 68   Appearance alert, well appearing, and in no distress and oriented to person, place, and time.  ASSESSMENT: GYN  blood pressure check  PLAN: Discussed with Derrek Monaco, AGNP   Recommendations: no changes needed. Advised to start taking Losartan as prescribed by PCP and f/u with them in a couple of weeks Follow-up:  with PCP    Alice Rieger  04/10/2022 10:39 AM

## 2022-04-15 NOTE — Patient Instructions (Signed)
Your procedure is scheduled on: 04/22/2022  Report to [facility] at  7:00   AM.  Call this number if you have problems the morning of surgery: [phone]   Remember:   Do not Eat or Drink after midnight         No Smoking the morning of surgery  :  Take these medicines the morning of surgery with A SIP OF WATER: Xanax, metoprolol and omerprazole   Do not wear jewelry, make-up or nail polish.  Do not wear lotions, powders, or perfumes. You may wear deodorant.  Do not shave 48 hours prior to surgery. Men may shave face and neck.  Do not bring valuables to the hospital.  Contacts, dentures or bridgework may not be worn into surgery.  Leave suitcase in the car. After surgery it may be brought to your room.  For patients admitted to the hospital, checkout time is 11:00 AM the day of discharge.   Patients discharged the day of surgery will not be allowed to drive home.    Special Instructions: Shower using CHG night before surgery and shower the day of surgery use CHG.  Use special wash - you have one bottle of CHG for all showers.  You should use approximately 1/2 of the bottle for each shower. How to Use Chlorhexidine for Bathing Chlorhexidine gluconate (CHG) is a germ-killing (antiseptic) solution that is used to clean the skin. It can get rid of the bacteria that normally live on the skin and can keep them away for about 24 hours. To clean your skin with CHG, you may be given: A CHG solution to use in the shower or as part of a sponge bath. A prepackaged cloth that contains CHG. Cleaning your skin with CHG may help lower the risk for infection: While you are staying in the intensive care unit of the hospital. If you have a vascular access, such as a central line, to provide short-term or long-term access to your veins. If you have a catheter to drain urine from your bladder. If you are on a ventilator. A ventilator is a machine that helps you breathe by moving air in and  out of your lungs. After surgery. What are the risks? Risks of using CHG include: A skin reaction. Hearing loss, if CHG gets in your ears and you have a perforated eardrum. Eye injury, if CHG gets in your eyes and is not rinsed out. The CHG product catching fire. Make sure that you avoid smoking and flames after applying CHG to your skin. Do not use CHG: If you have a chlorhexidine allergy or have previously reacted to chlorhexidine. On babies younger than 86 months of age. How to use CHG solution Use CHG only as told by your health care provider, and follow the instructions on the label. Use the full amount of CHG as directed. Usually, this is one bottle. During a shower Follow these steps when using CHG solution during a shower (unless your health care provider gives you different instructions): Start the shower. Use your normal soap and shampoo to wash your face and hair. Turn off the shower or move out of the shower stream. Pour the CHG onto a clean washcloth. Do not use any type of brush or rough-edged sponge. Starting at your neck, lather your body down to your toes. Make sure you follow these instructions: If you will be having surgery, pay special attention to the part of your body where you will be having  surgery. Scrub this area for at least 1 minute. Do not use CHG on your head or face. If the solution gets into your ears or eyes, rinse them well with water. Avoid your genital area. Avoid any areas of skin that have broken skin, cuts, or scrapes. Scrub your back and under your arms. Make sure to wash skin folds. Let the lather sit on your skin for 1-2 minutes or as long as told by your health care provider. Thoroughly rinse your entire body in the shower. Make sure that all body creases and crevices are rinsed well. Dry off with a clean towel. Do not put any substances on your body afterward--such as powder, lotion, or perfume--unless you are told to do so by your health care  provider. Only use lotions that are recommended by the manufacturer. Put on clean clothes or pajamas. If it is the night before your surgery, sleep in clean sheets.  During a sponge bath Follow these steps when using CHG solution during a sponge bath (unless your health care provider gives you different instructions): Use your normal soap and shampoo to wash your face and hair. Pour the CHG onto a clean washcloth. Starting at your neck, lather your body down to your toes. Make sure you follow these instructions: If you will be having surgery, pay special attention to the part of your body where you will be having surgery. Scrub this area for at least 1 minute. Do not use CHG on your head or face. If the solution gets into your ears or eyes, rinse them well with water. Avoid your genital area. Avoid any areas of skin that have broken skin, cuts, or scrapes. Scrub your back and under your arms. Make sure to wash skin folds. Let the lather sit on your skin for 1-2 minutes or as long as told by your health care provider. Using a different clean, wet washcloth, thoroughly rinse your entire body. Make sure that all body creases and crevices are rinsed well. Dry off with a clean towel. Do not put any substances on your body afterward--such as powder, lotion, or perfume--unless you are told to do so by your health care provider. Only use lotions that are recommended by the manufacturer. Put on clean clothes or pajamas. If it is the night before your surgery, sleep in clean sheets. How to use CHG prepackaged cloths Only use CHG cloths as told by your health care provider, and follow the instructions on the label. Use the CHG cloth on clean, dry skin. Do not use the CHG cloth on your head or face unless your health care provider tells you to. When washing with the CHG cloth: Avoid your genital area. Avoid any areas of skin that have broken skin, cuts, or scrapes. Before surgery Follow these steps  when using a CHG cloth to clean before surgery (unless your health care provider gives you different instructions): Using the CHG cloth, vigorously scrub the part of your body where you will be having surgery. Scrub using a back-and-forth motion for 3 minutes. The area on your body should be completely wet with CHG when you are done scrubbing. Do not rinse. Discard the cloth and let the area air-dry. Do not put any substances on the area afterward, such as powder, lotion, or perfume. Put on clean clothes or pajamas. If it is the night before your surgery, sleep in clean sheets.  For general bathing Follow these steps when using CHG cloths for general bathing (unless your health  care provider gives you different instructions). Use a separate CHG cloth for each area of your body. Make sure you wash between any folds of skin and between your fingers and toes. Wash your body in the following order, switching to a new cloth after each step: The front of your neck, shoulders, and chest. Both of your arms, under your arms, and your hands. Your stomach and groin area, avoiding the genitals. Your right leg and foot. Your left leg and foot. The back of your neck, your back, and your buttocks. Do not rinse. Discard the cloth and let the area air-dry. Do not put any substances on your body afterward--such as powder, lotion, or perfume--unless you are told to do so by your health care provider. Only use lotions that are recommended by the manufacturer. Put on clean clothes or pajamas. Contact a health care provider if: Your skin gets irritated after scrubbing. You have questions about using your solution or cloth. You swallow any chlorhexidine. Call your local poison control center (1-754-777-0994 in the [place] Get help right away if: Your eyes itch badly, or they become very red or swollen. Your skin itches badly and is red or swollen. Your hearing changes. You have trouble seeing. You have swelling or  tingling in your mouth or throat. You have trouble breathing. These symptoms may represent a serious problem that is an emergency. Do not wait to see if the symptoms will go away. Get medical help right away. Call your local emergency services (911 in the [place] Do not drive yourself to the hospital. Summary Chlorhexidine gluconate (CHG) is a germ-killing (antiseptic) solution that is used to clean the skin. Cleaning your skin with CHG may help to lower your risk for infection. You may be given CHG to use for bathing. It may be in a bottle or in a prepackaged cloth to use on your skin. Carefully follow your health care provider's instructions and the instructions on the product label. Do not use CHG if you have a chlorhexidine allergy. Contact your health care provider if your skin gets irritated after scrubbing. This information is not intended to replace advice given to you by your health care provider. Make sure you discuss any questions you have with your health care provider. Document Revised: 12/23/2020 Document Reviewed: 12/23/2020 Elsevier Patient Education  [place]. Abdominal Hysterectomy, Care After The following information offers guidance on how to care for yourself after your procedure. Your doctor may also give you more specific instructions. If you have problems or questions, contact your doctor. What can I expect after the procedure? After the procedure, it is common to have: Pain. Tiredness. No desire to eat. Less interest in sex. Bleeding and fluid (discharge) from your vagina. You may need to use a pad after this procedure. Trouble pooping (constipation). Feelings of sadness or other emotions. Follow these instructions at home: Medicines Take over-the-counter and prescription medicines only as told by your doctor. If you were prescribed an antibiotic medicine, take it as told by your doctor. Do not stop using the antibiotic even if you start to feel better. If  told, take steps to prevent problems with pooping (constipation). You may need to: Drink enough fluid to keep your pee (urine) pale yellow. Take medicines. You will be told what medicines to take. Eat foods that are high in fiber. These include beans, whole grains, and fresh fruits and vegetables. Limit foods that are high in fat and sugar. These include fried or sweet foods. Ask  your doctor if you should avoid driving or using machines while you are taking your medicine. Surgical cut care  Follow instructions from your doctor about how to take care of your cut from surgery (incision). Make sure you: Wash your hands with soap and water for at least 20 seconds before and after you change your bandage. If you cannot use soap and water, use hand sanitizer. Change your bandage. Leave stitches or skin glue in place for at least two weeks. Leave tape strips alone unless you are told to take them off. You may trim the edges of the tape strips if they curl up. Keep the bandage dry until your doctor says it can be taken off. Check your incision every day for signs of infection. Check for: More redness, swelling, or pain. Fluid or blood. Warmth. Pus or a bad smell. Activity  Rest as told by your doctor. Get up to take short walks every 1 to 2 hours. Ask for help if you feel weak or unsteady. Do not lift anything that is heavier than 10 lb (4.5 kg), or the limit that you are told. Follow your doctor's advice about exercise, driving, and general activities. Return to your normal activities when your doctor says that it is safe. Lifestyle Do not douche, use tampons, or have sex for at least 6 weeks or as told by your doctor. Do not drink alcohol until your doctor says it is okay. Do not smoke or use any products that contain nicotine or tobacco. These can delay healing after surgery. If you need help quitting, ask your doctor. General instructions Do not take baths, swim, or use a hot tub. Ask your  doctor about taking showers or sponge baths. Try to have a responsible adult at home with you for the first 1-2 weeks to help with your daily chores. Wear tight-fitting (compression) stockings as told by your doctor. Keep all follow-up visits. Contact a doctor if: You have chills or a fever. You have any of these signs of infection around your cut: More redness, swelling, or pain. Fluid or blood. Warmth. Pus or a bad smell. Your cut breaks open. You feel dizzy or light-headed. You have pain or bleeding when you pee. You keep having watery poop (diarrhea). You keep feeling like you may vomit or you keep vomiting. You have fluid coming from your vagina that is not normal. You have any type of reaction to your medicine that is not normal, like a rash, or you develop an allergy to your medicine. Your pain medicine does not help. Get help right away if: You have a fever and your symptoms get worse suddenly. You have very bad pain in your belly (abdomen). You are short of breath. You faint. You have pain, swelling, or redness of your leg. You bleed a lot from your vagina and you see blood clots. Summary It is normal to have some pain, tiredness, and fluid that comes from your vagina. Do not take baths, swim, or use a hot tub. Ask your doctor about taking showers or sponge baths. Do not lift anything that is heavier than 10 lb (4.5 kg), or the limit that you are told. Follow your doctor's advice about exercise, driving, and general activities. Try to have a responsible adult at home with you for the first 1-2 weeks to help with your daily chores. This information is not intended to replace advice given to you by your health care provider. Make sure you discuss any questions you have  with your health care provider. Document Revised: 12/20/2020 Document Reviewed: 06/13/2020 Elsevier Patient Education  [place] Anesthesia, Adult, Care After This sheet gives you  information about how to care for yourself after your procedure. Your health care provider may also give you more specific instructions. If you have problems or questions, contact your health care provider. What can I expect after the procedure? After the procedure, the following side effects are common: Pain or discomfort at the IV site. Nausea. Vomiting. Sore throat. Trouble concentrating. Feeling cold or chills. Feeling weak or tired. Sleepiness and fatigue. Soreness and body aches. These side effects can affect parts of the body that were not involved in surgery. Follow these instructions at home: For the time period you were told by your health care provider:  Rest. Do not participate in activities where you could fall or become injured. Do not drive or use machinery. Do not drink alcohol. Do not take sleeping pills or medicines that cause drowsiness. Do not make important decisions or sign legal documents. Do not take care of children on your own. Eating and drinking Follow any instructions from your health care provider about eating or drinking restrictions. When you feel hungry, start by eating small amounts of foods that are soft and easy to digest (bland), such as toast. Gradually return to your regular diet. Drink enough fluid to keep your urine pale yellow. If you vomit, rehydrate by drinking water, juice, or clear broth. General instructions If you have sleep apnea, surgery and certain medicines can increase your risk for breathing problems. Follow instructions from your health care provider about wearing your sleep device: Anytime you are sleeping, including during daytime naps. While taking prescription pain medicines, sleeping medicines, or medicines that make you drowsy. Have a responsible adult stay with you for the time you are told. It is important to have someone help care for you until you are awake and alert. Return to your normal activities as told by your  health care provider. Ask your health care provider what activities are safe for you. Take over-the-counter and prescription medicines only as told by your health care provider. If you smoke, do not smoke without supervision. Keep all follow-up visits as told by your health care provider. This is important. Contact a health care provider if: You have nausea or vomiting that does not get better with medicine. You cannot eat or drink without vomiting. You have pain that does not get better with medicine. You are unable to pass urine. You develop a skin rash. You have a fever. You have redness around your IV site that gets worse. Get help right away if: You have difficulty breathing. You have chest pain. You have blood in your urine or stool, or you vomit blood. Summary After the procedure, it is common to have a sore throat or nausea. It is also common to feel tired. Have a responsible adult stay with you for the time you are told. It is important to have someone help care for you until you are awake and alert. When you feel hungry, start by eating small amounts of foods that are soft and easy to digest (bland), such as toast. Gradually return to your regular diet. Drink enough fluid to keep your urine pale yellow. Return to your normal activities as told by your health care provider. Ask your health care provider what activities are safe for you. This information is not intended to replace advice given to you by your health  care provider. Make sure you discuss any questions you have with your health care provider. Document Revised: 06/27/2020 Document Reviewed: 01/25/2020 Elsevier Patient Education  [place].

## 2022-04-20 ENCOUNTER — Other Ambulatory Visit (HOSPITAL_COMMUNITY)
Admission: RE | Admit: 2022-04-20 | Discharge: 2022-04-20 | Disposition: A | Discharge status: Home / Self Care | Payer: 59 | Source: Ambulatory Visit | Attending: Obstetrics & Gynecology | Admitting: Obstetrics & Gynecology

## 2022-04-20 ENCOUNTER — Encounter (HOSPITAL_COMMUNITY): Payer: Self-pay

## 2022-04-20 ENCOUNTER — Encounter (HOSPITAL_BASED_OUTPATIENT_CLINIC_OR_DEPARTMENT_OTHER)
Admission: RE | Admit: 2022-04-20 | Discharge: 2022-04-20 | Disposition: A | Discharge status: Home / Self Care | Payer: 59 | Source: Ambulatory Visit | Attending: Obstetrics & Gynecology | Admitting: Obstetrics & Gynecology

## 2022-04-20 VITALS — BP 165/101 | HR 56 | Temp 97.8°F | Resp 18 | Ht 60.0 in | Wt 274.2 lb

## 2022-04-20 DIAGNOSIS — I10 Essential (primary) hypertension: Secondary | ICD-10-CM | POA: Insufficient documentation

## 2022-04-20 DIAGNOSIS — N921 Excessive and frequent menstruation with irregular cycle: Secondary | ICD-10-CM | POA: Insufficient documentation

## 2022-04-20 DIAGNOSIS — Z01818 Encounter for other preprocedural examination: Secondary | ICD-10-CM | POA: Insufficient documentation

## 2022-04-20 DIAGNOSIS — Z01810 Encounter for preprocedural cardiovascular examination: Secondary | ICD-10-CM

## 2022-04-20 DIAGNOSIS — N83291 Other ovarian cyst, right side: Secondary | ICD-10-CM | POA: Insufficient documentation

## 2022-04-20 HISTORY — DX: Depression, unspecified: F32.A

## 2022-04-20 HISTORY — DX: Anxiety disorder, unspecified: F41.9

## 2022-04-20 HISTORY — DX: Nausea with vomiting, unspecified: R11.2

## 2022-04-20 HISTORY — DX: Other specified postprocedural states: Z98.890

## 2022-04-20 HISTORY — DX: Other complications of anesthesia, initial encounter: T88.59XA

## 2022-04-20 LAB — COMPREHENSIVE METABOLIC PANEL
ALT: 17 U/L (ref 0–44)
AST: 14 U/L — ABNORMAL LOW (ref 15–41)
Albumin: 3.4 g/dL — ABNORMAL LOW (ref 3.5–5.0)
Alkaline Phosphatase: 60 U/L (ref 38–126)
Anion gap: 8 (ref 5–15)
BUN: 17 mg/dL (ref 6–20)
CO2: 28 mmol/L (ref 22–32)
Calcium: 8.7 mg/dL — ABNORMAL LOW (ref 8.9–10.3)
Chloride: 102 mmol/L (ref 98–111)
Creatinine, Ser: 0.86 mg/dL (ref 0.44–1.00)
GFR, Estimated: >60 mL/min (ref >60)
Glucose, Bld: 97 mg/dL (ref 70–99)
Potassium: 4.1 mmol/L (ref 3.5–5.1)
Sodium: 138 mmol/L (ref 135–145)
Total Bilirubin: 0.3 mg/dL (ref 0.3–1.2)
Total Protein: 7.1 g/dL (ref 6.5–8.1)

## 2022-04-20 LAB — CBC
HCT: 38.7 % (ref 36.0–46.0)
Hemoglobin: 12.9 g/dL (ref 12.0–15.0)
MCH: 30.4 pg (ref 26.0–34.0)
MCHC: 33.3 g/dL (ref 30.0–36.0)
MCV: 91.1 fL (ref 80.0–100.0)
Platelets: 290 10*3/uL (ref 150–400)
RBC: 4.25 MIL/uL (ref 3.87–5.11)
RDW: 13.8 % (ref 11.5–15.5)
WBC: 6.9 10*3/uL (ref 4.0–10.5)
nRBC: 0 % (ref 0.0–0.2)

## 2022-04-20 LAB — SURGICAL PCR SCREEN
MRSA, PCR: NEGATIVE
Staphylococcus aureus: NEGATIVE

## 2022-04-20 LAB — TYPE AND SCREEN
ABO/RH(D): O POS
Antibody Screen: NEGATIVE

## 2022-04-20 LAB — PREGNANCY, URINE: Preg Test, Ur: NEGATIVE

## 2022-04-21 MED ORDER — GENTAMICIN SULFATE 40 MG/ML IJ SOLN
5.0000 mg/kg | INTRAVENOUS | Status: AC
  Administered 2022-04-22: 385.6 mg via INTRAVENOUS
  Filled 2022-04-21: qty 9.75

## 2022-04-21 MED ORDER — METRONIDAZOLE 500 MG/100ML IV SOLN
500.0000 mg | INTRAVENOUS | Status: AC
  Administered 2022-04-22: 500 mg via INTRAVENOUS
  Filled 2022-04-21: qty 100

## 2022-04-22 ENCOUNTER — Other Ambulatory Visit: Payer: Self-pay

## 2022-04-22 ENCOUNTER — Encounter (HOSPITAL_COMMUNITY)
Admission: RE | Disposition: A | Discharge status: Home / Self Care | Payer: Self-pay | Source: Home / Self Care | Attending: Obstetrics & Gynecology

## 2022-04-22 ENCOUNTER — Inpatient Hospital Stay (HOSPITAL_COMMUNITY)
Admission: RE | Admit: 2022-04-22 | Discharge: 2022-04-23 | DRG: 742 | Disposition: A | Discharge status: Home / Self Care | Payer: 59 | Attending: Obstetrics & Gynecology | Admitting: Obstetrics & Gynecology

## 2022-04-22 ENCOUNTER — Inpatient Hospital Stay (HOSPITAL_COMMUNITY): Payer: 59 | Admitting: Certified Registered Nurse Anesthetist

## 2022-04-22 ENCOUNTER — Encounter (HOSPITAL_COMMUNITY): Payer: Self-pay | Admitting: Obstetrics & Gynecology

## 2022-04-22 DIAGNOSIS — Z01818 Encounter for other preprocedural examination: Principal | ICD-10-CM

## 2022-04-22 DIAGNOSIS — I10 Essential (primary) hypertension: Secondary | ICD-10-CM | POA: Diagnosis present

## 2022-04-22 DIAGNOSIS — F418 Other specified anxiety disorders: Secondary | ICD-10-CM | POA: Diagnosis not present

## 2022-04-22 DIAGNOSIS — N921 Excessive and frequent menstruation with irregular cycle: Secondary | ICD-10-CM

## 2022-04-22 DIAGNOSIS — R102 Pelvic and perineal pain: Secondary | ICD-10-CM | POA: Diagnosis not present

## 2022-04-22 DIAGNOSIS — N83201 Unspecified ovarian cyst, right side: Secondary | ICD-10-CM

## 2022-04-22 DIAGNOSIS — Z888 Allergy status to other drugs, medicaments and biological substances status: Secondary | ICD-10-CM

## 2022-04-22 DIAGNOSIS — Z881 Allergy status to other antibiotic agents status: Secondary | ICD-10-CM

## 2022-04-22 DIAGNOSIS — N939 Abnormal uterine and vaginal bleeding, unspecified: Secondary | ICD-10-CM | POA: Diagnosis not present

## 2022-04-22 DIAGNOSIS — F32A Depression, unspecified: Secondary | ICD-10-CM | POA: Diagnosis present

## 2022-04-22 DIAGNOSIS — N83291 Other ovarian cyst, right side: Secondary | ICD-10-CM

## 2022-04-22 DIAGNOSIS — R69 Illness, unspecified: Secondary | ICD-10-CM | POA: Diagnosis not present

## 2022-04-22 DIAGNOSIS — Z79890 Hormone replacement therapy (postmenopausal): Secondary | ICD-10-CM | POA: Diagnosis not present

## 2022-04-22 DIAGNOSIS — Z6841 Body Mass Index (BMI) 40.0 and over, adult: Secondary | ICD-10-CM | POA: Diagnosis not present

## 2022-04-22 DIAGNOSIS — Z79899 Other long term (current) drug therapy: Secondary | ICD-10-CM

## 2022-04-22 DIAGNOSIS — N8301 Follicular cyst of right ovary: Secondary | ICD-10-CM | POA: Diagnosis not present

## 2022-04-22 DIAGNOSIS — E7800 Pure hypercholesterolemia, unspecified: Secondary | ICD-10-CM | POA: Diagnosis present

## 2022-04-22 DIAGNOSIS — F419 Anxiety disorder, unspecified: Secondary | ICD-10-CM | POA: Diagnosis present

## 2022-04-22 DIAGNOSIS — Z9049 Acquired absence of other specified parts of digestive tract: Secondary | ICD-10-CM

## 2022-04-22 DIAGNOSIS — D3911 Neoplasm of uncertain behavior of right ovary: Secondary | ICD-10-CM | POA: Diagnosis not present

## 2022-04-22 DIAGNOSIS — N879 Dysplasia of cervix uteri, unspecified: Secondary | ICD-10-CM | POA: Diagnosis not present

## 2022-04-22 HISTORY — PX: OOPHORECTOMY: SHX6387

## 2022-04-22 HISTORY — PX: BILATERAL SALPINGECTOMY: SHX5743

## 2022-04-22 HISTORY — PX: ABDOMINAL HYSTERECTOMY: SHX81

## 2022-04-22 LAB — ABO/RH: ABO/RH(D): O POS

## 2022-04-22 SURGERY — HYSTERECTOMY, ABDOMINAL
Anesthesia: General | Site: Abdomen | Laterality: Right

## 2022-04-22 MED ORDER — ACETAMINOPHEN 325 MG PO TABS
650.0000 mg | ORAL_TABLET | Freq: Four times a day (QID) | ORAL | Status: DC | PRN
  Administered 2022-04-22: 650 mg via ORAL
  Filled 2022-04-22: qty 2

## 2022-04-22 MED ORDER — ROCURONIUM 10MG/ML (10ML) SYRINGE FOR MEDFUSION PUMP - OPTIME
INTRAVENOUS | Status: DC | PRN
  Administered 2022-04-22: 10 mg via INTRAVENOUS
  Administered 2022-04-22: 40 mg via INTRAVENOUS

## 2022-04-22 MED ORDER — GABAPENTIN 300 MG PO CAPS
300.0000 mg | ORAL_CAPSULE | Freq: Three times a day (TID) | ORAL | Status: DC
  Filled 2022-04-22 (×3): qty 1

## 2022-04-22 MED ORDER — PROPOFOL 10 MG/ML IV BOLUS
INTRAVENOUS | Status: AC
  Filled 2022-04-22: qty 20

## 2022-04-22 MED ORDER — FENTANYL CITRATE PF 50 MCG/ML IJ SOSY: 50.0000 ug | PREFILLED_SYRINGE | INTRAMUSCULAR | Status: DC | PRN

## 2022-04-22 MED ORDER — HEPARIN SODIUM (PORCINE) 1000 UNIT/ML IJ SOLN
INTRAMUSCULAR | Status: AC
  Filled 2022-04-22: qty 5

## 2022-04-22 MED ORDER — HEMOSTATIC AGENTS (NO CHARGE) OPTIME
TOPICAL | Status: DC | PRN
  Administered 2022-04-22: 1 via TOPICAL

## 2022-04-22 MED ORDER — SODIUM CHLORIDE 0.9 % IR SOLN
Status: DC | PRN
  Administered 2022-04-22 (×2): 1000 mL

## 2022-04-22 MED ORDER — LOSARTAN POTASSIUM 50 MG PO TABS
50.0000 mg | ORAL_TABLET | Freq: Every day | ORAL | Status: DC
  Administered 2022-04-23: 50 mg via ORAL
  Filled 2022-04-22: qty 1

## 2022-04-22 MED ORDER — LIDOCAINE HCL (CARDIAC) PF 50 MG/5ML IV SOSY
PREFILLED_SYRINGE | INTRAVENOUS | Status: DC | PRN
  Administered 2022-04-22: 80 mg via INTRAVENOUS

## 2022-04-22 MED ORDER — ONDANSETRON HCL 4 MG/2ML IJ SOLN: 4.0000 mg | Freq: Four times a day (QID) | INTRAMUSCULAR | Status: DC | PRN

## 2022-04-22 MED ORDER — SCOPOLAMINE 1 MG/3DAYS TD PT72
MEDICATED_PATCH | TRANSDERMAL | Status: AC
  Administered 2022-04-22: 1.5 mg via TRANSDERMAL
  Filled 2022-04-22: qty 1

## 2022-04-22 MED ORDER — HYDROXYZINE HCL 25 MG PO TABS
25.0000 mg | ORAL_TABLET | Freq: Three times a day (TID) | ORAL | Status: DC | PRN
Start: 2022-04-22 — End: 2022-04-23

## 2022-04-22 MED ORDER — BUPIVACAINE-MELOXICAM ER 400-12 MG/14ML IJ SOLN
INTRAMUSCULAR | Status: DC | PRN
  Administered 2022-04-22: 7 mL

## 2022-04-22 MED ORDER — FENTANYL CITRATE (PF) 100 MCG/2ML IJ SOLN
INTRAMUSCULAR | Status: DC | PRN
  Administered 2022-04-22: 500 ug via INTRAVENOUS

## 2022-04-22 MED ORDER — ONDANSETRON HCL 4 MG/2ML IJ SOLN
INTRAMUSCULAR | Status: DC | PRN
  Administered 2022-04-22: 4 mg via INTRAVENOUS

## 2022-04-22 MED ORDER — PROPOFOL 10 MG/ML IV BOLUS
INTRAVENOUS | Status: DC | PRN
  Administered 2022-04-22: 50 mg via INTRAVENOUS
  Administered 2022-04-22: 150 mg via INTRAVENOUS

## 2022-04-22 MED ORDER — BUPIVACAINE-MELOXICAM ER 200-6 MG/7ML IJ SOLN
INTRAMUSCULAR | Status: AC
  Filled 2022-04-22: qty 1

## 2022-04-22 MED ORDER — OXYCODONE HCL 5 MG PO TABS: 5.0000 mg | ORAL_TABLET | Freq: Four times a day (QID) | ORAL | Status: DC | PRN

## 2022-04-22 MED ORDER — LACTATED RINGERS IV SOLN: INTRAVENOUS | Status: DC

## 2022-04-22 MED ORDER — POVIDONE-IODINE 10 % EX SWAB: 2.0000 | Freq: Once | CUTANEOUS | Status: DC

## 2022-04-22 MED ORDER — PANTOPRAZOLE SODIUM 40 MG PO TBEC
40.0000 mg | DELAYED_RELEASE_TABLET | Freq: Every day | ORAL | Status: DC
  Administered 2022-04-22 – 2022-04-23 (×2): 40 mg via ORAL
  Filled 2022-04-22 (×2): qty 1

## 2022-04-22 MED ORDER — SUCCINYLCHOLINE 20MG/ML (10ML) SYRINGE FOR MEDFUSION PUMP - OPTIME
INTRAMUSCULAR | Status: DC | PRN
  Administered 2022-04-22: 130 mg via INTRAVENOUS

## 2022-04-22 MED ORDER — DOCUSATE SODIUM 100 MG PO CAPS
100.0000 mg | ORAL_CAPSULE | Freq: Two times a day (BID) | ORAL | Status: DC
Start: 2022-04-22 — End: 2022-04-23
  Administered 2022-04-22 – 2022-04-23 (×3): 100 mg via ORAL
  Filled 2022-04-22 (×4): qty 1

## 2022-04-22 MED ORDER — ENOXAPARIN SODIUM 60 MG/0.6ML IJ SOSY
60.0000 mg | PREFILLED_SYRINGE | INTRAMUSCULAR | Status: DC
  Administered 2022-04-23: 60 mg via SUBCUTANEOUS
  Filled 2022-04-22: qty 0.6

## 2022-04-22 MED ORDER — MIDAZOLAM HCL 2 MG/2ML IJ SOLN
INTRAMUSCULAR | Status: AC
  Filled 2022-04-22: qty 2

## 2022-04-22 MED ORDER — CHLORHEXIDINE GLUCONATE 0.12 % MT SOLN: 15.0000 mL | Freq: Once | OROMUCOSAL | Status: DC

## 2022-04-22 MED ORDER — METOPROLOL SUCCINATE ER 50 MG PO TB24
50.0000 mg | ORAL_TABLET | Freq: Every day | ORAL | Status: DC
  Administered 2022-04-23: 50 mg via ORAL
  Filled 2022-04-22: qty 1

## 2022-04-22 MED ORDER — LIDOCAINE HCL (PF) 2 % IJ SOLN
INTRAMUSCULAR | Status: AC
  Filled 2022-04-22: qty 5

## 2022-04-22 MED ORDER — KETOROLAC TROMETHAMINE 30 MG/ML IJ SOLN
30.0000 mg | Freq: Four times a day (QID) | INTRAMUSCULAR | Status: AC
  Administered 2022-04-22 – 2022-04-23 (×4): 30 mg via INTRAVENOUS
  Filled 2022-04-22 (×4): qty 1

## 2022-04-22 MED ORDER — FENTANYL CITRATE PF 50 MCG/ML IJ SOSY
PREFILLED_SYRINGE | INTRAMUSCULAR | Status: AC
  Filled 2022-04-22: qty 1

## 2022-04-22 MED ORDER — KETOROLAC TROMETHAMINE 15 MG/ML IJ SOLN
15.0000 mg | INTRAMUSCULAR | Status: AC
  Administered 2022-04-22: 15 mg via INTRAVENOUS
  Filled 2022-04-22: qty 1

## 2022-04-22 MED ORDER — SUGAMMADEX SODIUM 200 MG/2ML IV SOLN
INTRAVENOUS | Status: DC | PRN
  Administered 2022-04-22: 200 mg via INTRAVENOUS

## 2022-04-22 MED ORDER — MIDAZOLAM HCL 2 MG/2ML IJ SOLN: 1.0000 mg | Freq: Once | INTRAMUSCULAR | Status: AC

## 2022-04-22 MED ORDER — MENTHOL 3 MG MT LOZG: 1.0000 | LOZENGE | OROMUCOSAL | Status: DC | PRN

## 2022-04-22 MED ORDER — IBUPROFEN 400 MG PO TABS: 600.0000 mg | ORAL_TABLET | Freq: Four times a day (QID) | ORAL | Status: DC

## 2022-04-22 MED ORDER — ORAL CARE MOUTH RINSE: 15.0000 mL | Freq: Once | OROMUCOSAL | Status: DC

## 2022-04-22 MED ORDER — MIDAZOLAM HCL 2 MG/2ML IJ SOLN
INTRAMUSCULAR | Status: AC
  Administered 2022-04-22: 1 mg via INTRAVENOUS
  Filled 2022-04-22: qty 2

## 2022-04-22 MED ORDER — ONDANSETRON HCL 4 MG/2ML IJ SOLN
4.0000 mg | Freq: Once | INTRAMUSCULAR | Status: AC | PRN
  Administered 2022-04-22: 4 mg via INTRAVENOUS
  Filled 2022-04-22: qty 2

## 2022-04-22 MED ORDER — ONDANSETRON HCL 4 MG PO TABS: 4.0000 mg | ORAL_TABLET | Freq: Four times a day (QID) | ORAL | Status: DC | PRN

## 2022-04-22 MED ORDER — FENTANYL CITRATE (PF) 250 MCG/5ML IJ SOLN
INTRAMUSCULAR | Status: AC
  Filled 2022-04-22: qty 5

## 2022-04-22 MED ORDER — SCOPOLAMINE 1 MG/3DAYS TD PT72: 1.0000 | MEDICATED_PATCH | Freq: Once | TRANSDERMAL | Status: DC

## 2022-04-22 SURGICAL SUPPLY — 70 items
APPLICATOR COTTON TIP 6 STRL (MISCELLANEOUS) ×2 IMPLANT
APPLICATOR COTTON TIP 6IN STRL (MISCELLANEOUS) ×3
APPLIER CLIP 13 LRG OPEN (CLIP)
CLIP APPLIE 13 LRG OPEN (CLIP) IMPLANT
CLOTH BEACON ORANGE TIMEOUT ST (SAFETY) ×6 IMPLANT
CNTNR URN SCR LID CUP LEK RST (MISCELLANEOUS) IMPLANT
CONT SPEC 4OZ STRL OR WHT (MISCELLANEOUS) ×1
COVER LIGHT HANDLE STERIS (MISCELLANEOUS) ×6 IMPLANT
COVER SURGICAL LIGHT HANDLE (MISCELLANEOUS) ×6 IMPLANT
DERMABOND ADVANCED (GAUZE/BANDAGES/DRESSINGS) ×1
DERMABOND ADVANCED .7 DNX12 (GAUZE/BANDAGES/DRESSINGS) ×2 IMPLANT
DRAPE WARM FLUID 44X44 (DRAPES) ×3 IMPLANT
DRESSING PEEL AND PLAC PRVNA20 (GAUZE/BANDAGES/DRESSINGS) IMPLANT
DRSG OPSITE POSTOP 4X10 (GAUZE/BANDAGES/DRESSINGS) IMPLANT
DRSG OPSITE POSTOP 4X8 (GAUZE/BANDAGES/DRESSINGS) IMPLANT
DRSG PEEL AND PLACE PREVENA 20 (GAUZE/BANDAGES/DRESSINGS) ×3
DURAPREP 26ML APPLICATOR (WOUND CARE) ×3 IMPLANT
ELECT REM PT RETURN 9FT ADLT (ELECTROSURGICAL) ×3
ELECTRODE REM PT RTRN 9FT ADLT (ELECTROSURGICAL) ×2 IMPLANT
GAUZE 4X4 16PLY ~~LOC~~+RFID DBL (SPONGE) ×6 IMPLANT
GLOVE BIO SURGEON STRL SZ 6.5 (GLOVE) ×3 IMPLANT
GLOVE BIOGEL PI IND STRL 7.0 (GLOVE) ×6 IMPLANT
GLOVE BIOGEL PI INDICATOR 7.0 (GLOVE) ×3
GLOVE ECLIPSE 8.0 STRL XLNG CF (GLOVE) ×1 IMPLANT
GLOVE SRG 8 PF TXTR STRL LF DI (GLOVE) ×2 IMPLANT
GLOVE SS BIOGEL STRL SZ 6.5 (GLOVE) ×2 IMPLANT
GLOVE SUPERSENSE BIOGEL SZ 6.5 (GLOVE) ×1
GLOVE SURG GAMMEX PI TX LF 6.5 (GLOVE) ×3 IMPLANT
GLOVE SURG LTX SZ6.5 (GLOVE) ×6 IMPLANT
GLOVE SURG UNDER POLY LF SZ8 (GLOVE) ×1
GOWN STRL REUS W/ TWL LRG LVL3 (GOWN DISPOSABLE) ×2 IMPLANT
GOWN STRL REUS W/TWL LRG LVL3 (GOWN DISPOSABLE) ×7 IMPLANT
GOWN STRL REUS W/TWL XL LVL3 (GOWN DISPOSABLE) ×3 IMPLANT
KIT BLADEGUARD II DBL (SET/KITS/TRAYS/PACK) ×3 IMPLANT
KIT PUMP PREVENA PLUS 14DAY (MISCELLANEOUS) ×1 IMPLANT
KIT TURNOVER KIT A (KITS) ×6 IMPLANT
MANIFOLD NEPTUNE II (INSTRUMENTS) ×3 IMPLANT
NDL HYPO 18GX1.5 BLUNT FILL (NEEDLE) IMPLANT
NDL HYPO 21X1.5 SAFETY (NEEDLE) ×2 IMPLANT
NEEDLE HYPO 18GX1.5 BLUNT FILL (NEEDLE) ×3 IMPLANT
NEEDLE HYPO 21X1.5 SAFETY (NEEDLE) ×3 IMPLANT
NS IRRIG 1000ML POUR BTL (IV SOLUTION) ×6 IMPLANT
PACK ABDOMINAL MAJOR (CUSTOM PROCEDURE TRAY) ×3 IMPLANT
PAD ARMBOARD 7.5X6 YLW CONV (MISCELLANEOUS) ×6 IMPLANT
PENCIL SMOKE EVACUATOR (MISCELLANEOUS) ×3 IMPLANT
POWDER SURGICEL 3.0 GRAM (HEMOSTASIS) ×1 IMPLANT
RETRACTOR WND ALEXIS-O 25 LRG (MISCELLANEOUS) IMPLANT
RETRACTOR WOUND ALXS 18CM MED (MISCELLANEOUS) ×2 IMPLANT
RTRCTR WOUND ALEXIS O 18CM MED (MISCELLANEOUS)
RTRCTR WOUND ALEXIS O 25CM LRG (MISCELLANEOUS) ×3
SET BASIN LINEN APH (SET/KITS/TRAYS/PACK) ×6 IMPLANT
SOL PREP POV-IOD 4OZ 10% (MISCELLANEOUS) ×3 IMPLANT
SPONGE T-LAP 18X18 ~~LOC~~+RFID (SPONGE) ×6 IMPLANT
SUT CHROMIC 0 CT 1 (SUTURE) ×6 IMPLANT
SUT CHROMIC 2 0 CT 1 (SUTURE) ×6 IMPLANT
SUT MON AB 3-0 SH 27 (SUTURE) IMPLANT
SUT VIC AB 0 CT1 27 (SUTURE) ×4
SUT VIC AB 0 CT1 27XBRD ANTBC (SUTURE) IMPLANT
SUT VIC AB 0 CT1 27XCR 8 STRN (SUTURE) ×4 IMPLANT
SUT VIC AB 0 CT1 36 (SUTURE) ×3 IMPLANT
SUT VIC AB 0 CTX 36 (SUTURE) ×2
SUT VIC AB 0 CTX36XBRD ANTBCTR (SUTURE) ×2 IMPLANT
SUT VIC AB 2-0 CT1 27 (SUTURE)
SUT VIC AB 2-0 CT1 TAPERPNT 27 (SUTURE) IMPLANT
SUT VICRYL 0 TIES 12 18 (SUTURE) IMPLANT
SUT VICRYL 3 0 (SUTURE) ×3 IMPLANT
SYR 5ML LL (SYRINGE) ×1 IMPLANT
TOWEL ~~LOC~~+RFID 17X26 BLUE (SPONGE) ×3 IMPLANT
TRAY FOLEY W/BAG SLVR 16FR (SET/KITS/TRAYS/PACK) ×1
TRAY FOLEY W/BAG SLVR 16FR ST (SET/KITS/TRAYS/PACK) ×2 IMPLANT

## 2022-04-22 NOTE — Anesthesia Preprocedure Evaluation (Signed)
Anesthesia Evaluation  Patient identified by MRN, date of birth, ID band Patient awake    Reviewed: Allergy & Precautions, H&P , NPO status , Patient's Chart, lab work & pertinent test results, reviewed documented beta blocker date and time   History of Anesthesia Complications (+) PONV and history of anesthetic complications  Airway Mallampati: II  TM Distance: >3 FB Neck ROM: full    Dental no notable dental hx.    Pulmonary neg pulmonary ROS,    Pulmonary exam normal breath sounds clear to auscultation       Cardiovascular Exercise Tolerance: Good hypertension, negative cardio ROS   Rhythm:regular Rate:Normal     Neuro/Psych PSYCHIATRIC DISORDERS Anxiety Depression negative neurological ROS     GI/Hepatic negative GI ROS, Neg liver ROS,   Endo/Other  Morbid obesity  Renal/GU negative Renal ROS  negative genitourinary   Musculoskeletal   Abdominal   Peds  Hematology negative hematology ROS (+)   Anesthesia Other Findings   Reproductive/Obstetrics negative OB ROS                             Anesthesia Physical Anesthesia Plan  ASA: 3  Anesthesia Plan: General and General ETT   Post-op Pain Management:    Induction:   PONV Risk Score and Plan: Ondansetron  Airway Management Planned:   Additional Equipment:   Intra-op Plan:   Post-operative Plan:   Informed Consent: I have reviewed the patients History and Physical, chart, labs and discussed the procedure including the risks, benefits and alternatives for the proposed anesthesia with the patient or authorized representative who has indicated his/her understanding and acceptance.     Dental Advisory Given  Plan Discussed with: CRNA  Anesthesia Plan Comments:         Anesthesia Quick Evaluation

## 2022-04-22 NOTE — Anesthesia Procedure Notes (Signed)
Procedure Name: Intubation Date/Time: 04/22/2022 8:42 AM  Performed by: Ollen Bowl, CRNAPre-anesthesia Checklist: Patient identified, Patient being monitored, Timeout performed, Emergency Drugs available and Suction available Patient Re-evaluated:Patient Re-evaluated prior to induction Oxygen Delivery Method: Circle system utilized Preoxygenation: Pre-oxygenation with 100% oxygen Induction Type: IV induction Ventilation: Mask ventilation without difficulty Laryngoscope Size: Mac and 3 Grade View: Grade I Tube type: Oral Tube size: 7.0 mm Number of attempts: 1 Airway Equipment and Method: Stylet Placement Confirmation: ETT inserted through vocal cords under direct vision, positive ETCO2 and breath sounds checked- equal and bilateral Secured at: 20 cm Tube secured with: Tape Dental Injury: Teeth and Oropharynx as per pre-operative assessment

## 2022-04-22 NOTE — H&P (Signed)
[facility] Obstetrics and Gynecology Attending History and Physical  Michele Wu is a 41 y.o. [id number] who presents for scheduled total abdominal hysterectomy, bilateral salpingectomy, right ovarian cystectomy, possible oophorectomy.  In review, she has struggled with AUB.  Doing better with megace, but desires long term management. She also notes issues with pelvic pain not only during her period, but likely associated with ovarian cyst.  Rates her pain 7/10, no improvement with OTC medication.  Denies any abnormal vaginal discharge, fevers, chills, sweats, dysuria, nausea, vomiting, other GI or GU symptoms or other general symptoms.  Past Medical History:  Diagnosis Date   Anxiety    Chest pain    Complication of anesthesia    Depression    Dysuria    High cholesterol    Hypertension    Low back pain    PONV (postoperative nausea and vomiting)    Past Surgical History:  Procedure Laterality Date   CESAREAN SECTION     CHOLECYSTECTOMY     OB History  Gravida Para Term Preterm AB Living  '3 2 2   1 2  '$ SAB IAB Ectopic Multiple Live Births  1            # Outcome Date GA Lbr Len/2nd Weight Sex Delivery Anes PTL Lv  3 SAB           2 Term      CS-LTranv     1 Term      CS-LTranv     Patient denies any other pertinent gynecologic issues.  No current facility-administered medications on file prior to encounter.   Current Outpatient Medications on File Prior to Encounter  Medication Sig Dispense Refill   ALPRAZolam (XANAX) 1 MG tablet Take 1 mg by mouth 2 (two) times daily as needed.     megestrol (MEGACE) 40 MG tablet Take 2 tablets daily 60 tablet 3   metoprolol succinate (TOPROL-XL) 50 MG 24 hr tablet Take 50 mg by mouth daily.     omeprazole (PRILOSEC) 20 MG capsule Take 20 mg by mouth daily.     simvastatin (ZOCOR) 40 MG tablet Take 40 mg by mouth at bedtime.     ibuprofen (ADVIL,MOTRIN) 800 MG tablet Take 800 mg by mouth every 8 (eight) hours as needed.      Allergies  Allergen Reactions   Augmentin [Amoxicillin-Pot Clavulanate]     rash   Ciprofloxacin Nausea And Vomiting   Cymbalta [Duloxetine Hcl]     Nausea    Macrobid [Nitrofurantoin Macrocrystal]     Rash     Social History:   reports that she has never smoked. She has never used smokeless tobacco. She reports that she does not drink alcohol and does not use drugs. Family History  Adopted: Yes    Review of Systems: Pertinent items noted in HPI and remainder of comprehensive ROS otherwise negative.  PHYSICAL EXAM: Blood pressure 137/87, pulse 70, temperature 98.6 F (37 C), resp. rate (!) 22, height 5' (1.524 m), weight 124 kg, last menstrual period 03/09/2022, SpO2 98 %. .vs CONSTITUTIONAL: Well-developed, well-nourished female in no acute distress.  SKIN: Skin is warm and dry. No rash noted. Not diaphoretic. No erythema. No pallor. NEUROLOGIC: Alert and oriented to person, place, and time. Normal reflexes, muscle tone coordination. No cranial nerve deficit noted. PSYCHIATRIC: Normal mood and affect. Normal behavior. Normal judgment and thought content. CARDIOVASCULAR: Normal heart rate noted, regular rhythm RESPIRATORY: Effort and breath sounds normal, no problems with respiration noted  ABDOMEN: obese, Soft, mild tenderness, no rebound, no guarding PELVIC: deferred MUSCULOSKELETAL: no calf tenderness bilaterally EXT: no edema  Labs: Results for orders placed or performed during the hospital encounter of 04/20/22 (from the past 336 hour(s))  CBC   Collection Time: 04/20/22  8:53 AM  Result Value Ref Range   WBC 6.9 4.0 - 10.5 K/uL   RBC 4.25 3.87 - 5.11 MIL/uL   Hemoglobin 12.9 12.0 - 15.0 g/dL   HCT 38.7 36.0 - 46.0 %   MCV 91.1 80.0 - 100.0 fL   MCH 30.4 26.0 - 34.0 pg   MCHC 33.3 30.0 - 36.0 g/dL   RDW 13.8 11.5 - 15.5 %   Platelets 290 150 - 400 K/uL   nRBC 0.0 0.0 - 0.2 %  Comprehensive metabolic panel   Collection Time: 04/20/22  8:53 AM  Result Value  Ref Range   Sodium 138 135 - 145 mmol/L   Potassium 4.1 3.5 - 5.1 mmol/L   Chloride 102 98 - 111 mmol/L   CO2 28 22 - 32 mmol/L   Glucose, Bld 97 70 - 99 mg/dL   BUN 17 6 - 20 mg/dL   Creatinine, Ser 0.86 0.44 - 1.00 mg/dL   Calcium 8.7 (L) 8.9 - 10.3 mg/dL   Total Protein 7.1 6.5 - 8.1 g/dL   Albumin 3.4 (L) 3.5 - 5.0 g/dL   AST 14 (L) 15 - 41 U/L   ALT 17 0 - 44 U/L   Alkaline Phosphatase 60 38 - 126 U/L   Total Bilirubin 0.3 0.3 - 1.2 mg/dL   GFR, Estimated >60 >60 mL/min   Anion gap 8 5 - 15  Pregnancy, urine   Collection Time: 04/20/22  8:53 AM  Result Value Ref Range   Preg Test, Ur NEGATIVE NEGATIVE  Type and screen   Collection Time: 04/20/22  8:53 AM  Result Value Ref Range   ABO/RH(D) O POS    Antibody Screen NEG    Sample Expiration 05/04/2022,2359    Extend sample reason      NO TRANSFUSIONS OR PREGNANCY IN THE PAST 3 MONTHS Performed at [facility], [address], [place], [place] [address]   Results for orders placed or performed during the hospital encounter of 04/20/22 (from the past 336 hour(s))  Surgical pcr screen   Collection Time: 04/20/22  8:53 AM   Specimen: Nasal Mucosa; Nasal Swab  Result Value Ref Range   MRSA, PCR NEGATIVE NEGATIVE   Staphylococcus aureus NEGATIVE NEGATIVE    Imaging Studies: [name] report:   02/09/22: 5.8cm uterus with multiple fibroids and linear striations (#1) posterior fundal right intramural fibroid 2.6 x 2.5 x 1.9 cm,(#2) posterior mid submucosal vs intramural fibroid 2.5 x 1.6 x 1.7 cm   Ovaries: septated complex left ovarian cyst 2.3 x 1.8 x 1.1,normal right ovary,4.1 x 3.1 x 3.8  complex right adnexal cyst with a nonvascular nodule 1 x .6 cm,no free fluid,pelvic pain during ultrasound,ovaries appear mobile   Assessment: AUB Pelvic pain Right adnexal cyst   Plan: Total abdominal hysterectomy, bilateral salpingectomy, right cystectomy and possible oophorectomy -IV Gent/Flagyl -IV Toradol -NPO -LR @  125cc/hr -SCDs to OR -Risk/benefits and alternatives reviewed with the patient including but not limited to risk of bleeding, infection and injury to surrounding organs.  Questions and concerns were addressed and pt desires to proceed  Janyth Pupa, DO Attending [place], [facility] for [facility], [place]

## 2022-04-22 NOTE — Op Note (Signed)
Preop Dx: Abnormal uterine bleeding, Pelvic pain, right ovarian cyst Postop Dx: same Procedure: Total abdominal hysterectomy, bilateral salpingectomy, right ovarian cystectomy and oophorectomy Surgeon: Dr. Janyth Pupa Assistant: Dr. Darlis Loan Anesthesia:  general Complications: none EBL: 150cc Fluids: 1400cc UOP: 350cc  Findings: Normal uterus and bilateral fallopian tubes. 4cm paratubal right ovarian cyst.  Normal left ovary  Specimens: Uterus with cervix and bilateral fallopian tubes, right ovary, right ovarian cyst  Procedure:  The patient was taken to the operating room where a general anesthetic was given. A timeout was performed confirming the appropriate procedure. The patient's abdomen was prepped with DuraPrep. Her perineum and vagina were prepped with Betadine. A Foley catheter was placed in the bladder. The patient was sterilely draped. A low transverse incision was made in the lower abdomen and carried sharply through the subcutaneous tissue to the fascia.  The fascia was incised and extended laterally.  The inferior aspect of the fascia was grasped with Kocher clamps and the underlying rectus muscle was dissected off sharply.  In a similar fashion, the superior aspect of the fascia was elevated with Kocher clamps and the rectus muscle was dissected off.  Hemostasis was achieved with the bovie.  The rectus muscle was separated in the midline and the peritoneum was exposed.  The peritoneum was entered bluntly.  An Alexis self retaining retractor was placed. Pelvic washings were obtained.  The upper abdomen was packed with wet lap pads. Findings as noted above. Both uterine cornu were grasped with Kocker clamps.  The right round ligament was suture ligated and cut with the electrocautery unit. An avascular window was made in the right utero ovarian ligament broad ligament.  The pedicle was double suture ligated and cut.   Kelly clamps were placed to cut and ligate the para-tubal  ovarian cyst. Due to the location of the ovarian cyst in relation to the IP and ovary.  The ovary was also clamped, cut and suture ligated.  Specimens were sent to pathology.    The left round ligament was suture ligated and cut with the electrocautery unit. An avascular window was made in the right utero ovarian ligament broad ligament.  The pedicle was double suture ligated and cut.     Serial pedicles were taken medial to the utero ovarian pedicle.  Each pedicle was clamped cut and transfixion suture ligated.   The uterine vessels were skeletonized bilaterally.  The uterine vessels were clamped bilaterally,  then cut and suture ligated. Several  more pedicles were taken down the cervix medial to the uterine vessels.  Each pedicle was clamped cut and suture ligated with good resulting hemostasis.The vagina was cross clamped and the uterus with cervix was removed.  The vagina was closed in a running lock fashion.  Attention was turned to the left adnexa.  The left fallopian tube was grasped with a babcock and using a Kelly clamp was cut, clamped and suture ligated.   The pelvis was irrigated vigorously and all pedicles were examined and found to be hemostatic.  Surgicel  was used for additional hemostasis.  The rectus muscles could not be re-approximatedThe fascia was closed with 0 Vicryl running. Zyrnrelief was injected into the subcutaneous tissue for post operative pain management.   The skin was closed using 3-0 Vicryl on a Keith needle in a subcuticular fashion.  Dermabond was then applied for additional wound integrity and to serve as a postoperative bacterial barrier.  Provena placed.  The patient was awakened from anesthesia taken to  the recovery room in good stable condition. All sponge instrument and needle counts were correct x 3.  The patient received Ancef and Toradol prophylactically preoperatively.   Janyth Pupa, DO Attending [place], [facility] for  [employer], [place]

## 2022-04-22 NOTE — Transfer of Care (Signed)
Immediate Anesthesia Transfer of Care Note  Patient: Michele Wu  Procedure(s) Performed: HYSTERECTOMY ABDOMINAL (Abdomen) OPEN BILATERAL SALPINGECTOMY (Abdomen) RIGHT OOPHORECTOMY AND RIGHT OVARIAN CYST REMOVAL (Right: Abdomen)  Patient Location: PACU  Anesthesia Type:General  Level of Consciousness: awake  Airway & Oxygen Therapy: Patient Spontanous Breathing  Post-op Assessment: Report given to RN  Post vital signs: Reviewed and stable  Last Vitals:  Vitals Value Taken Time  BP 148/86 04/22/22 1054  Temp 36.7 C 04/22/22 1054  Pulse 66 04/22/22 1057  Resp 29 04/22/22 1057  SpO2 100 % 04/22/22 1057  Vitals shown include unvalidated device data.  Last Pain:  Vitals:   04/22/22 1054  PainSc: 6          Complications: No notable events documented.

## 2022-04-22 NOTE — Progress Notes (Addendum)
PT rates pain 10 out 10 pain in PACU, pt refuses any fentenyl or morphine. This nurse explained pain scale and importance of IV pain meds with pain of 10. PT continutes to refuse, states she doesn't want to " feel like this". RN informed pt of anesthesia effects and will continue to improve to baseline. States she only wants toradol. VSS, pt asking for soda., appears comfortable. Will continue to monitor.

## 2022-04-23 LAB — BASIC METABOLIC PANEL
Anion gap: 7 (ref 5–15)
BUN: 14 mg/dL (ref 6–20)
CO2: 24 mmol/L (ref 22–32)
Calcium: 8.2 mg/dL — ABNORMAL LOW (ref 8.9–10.3)
Chloride: 102 mmol/L (ref 98–111)
Creatinine, Ser: 0.88 mg/dL (ref 0.44–1.00)
GFR, Estimated: >60 mL/min (ref >60)
Glucose, Bld: 162 mg/dL — ABNORMAL HIGH (ref 70–99)
Potassium: 4.4 mmol/L (ref 3.5–5.1)
Sodium: 133 mmol/L — ABNORMAL LOW (ref 135–145)

## 2022-04-23 LAB — CBC
HCT: 27.8 % — ABNORMAL LOW (ref 36.0–46.0)
Hemoglobin: 9.2 g/dL — ABNORMAL LOW (ref 12.0–15.0)
MCH: 30.1 pg (ref 26.0–34.0)
MCHC: 33.1 g/dL (ref 30.0–36.0)
MCV: 90.8 fL (ref 80.0–100.0)
Platelets: 359 10*3/uL (ref 150–400)
RBC: 3.06 MIL/uL — ABNORMAL LOW (ref 3.87–5.11)
RDW: 14.1 % (ref 11.5–15.5)
WBC: 12.7 10*3/uL — ABNORMAL HIGH (ref 4.0–10.5)
nRBC: 0 % (ref 0.0–0.2)

## 2022-04-23 MED ORDER — ONDANSETRON HCL 4 MG PO TABS: 4.0000 mg | ORAL_TABLET | Freq: Three times a day (TID) | ORAL | 0 refills | Status: DC | PRN

## 2022-04-23 MED ORDER — DOCUSATE SODIUM 100 MG PO CAPS: 100.0000 mg | ORAL_CAPSULE | Freq: Two times a day (BID) | ORAL | 0 refills | Status: DC

## 2022-04-23 MED ORDER — ACETAMINOPHEN 325 MG PO TABS: 650.0000 mg | ORAL_TABLET | Freq: Four times a day (QID) | ORAL | Status: DC | PRN

## 2022-04-23 MED ORDER — OXYCODONE HCL 5 MG PO TABS: 5.0000 mg | ORAL_TABLET | Freq: Four times a day (QID) | ORAL | 0 refills | Status: AC | PRN

## 2022-04-23 MED ORDER — IBUPROFEN 600 MG PO TABS: 600.0000 mg | ORAL_TABLET | Freq: Four times a day (QID) | ORAL | 0 refills | Status: DC

## 2022-04-23 MED ORDER — GABAPENTIN 300 MG PO CAPS: 300.0000 mg | ORAL_CAPSULE | Freq: Three times a day (TID) | ORAL | 0 refills | Status: DC

## 2022-04-23 NOTE — Plan of Care (Signed)

## 2022-04-23 NOTE — Progress Notes (Signed)
Patient is ready for discharge. Patient tolerated all of her medications this morning, no complaints. Discharge instructions were explained and the patient has no questions at this time. Will follow up with physician at scheduled appointment. Advised to call if she has any questions or concerns.

## 2022-04-23 NOTE — Progress Notes (Signed)
POSTOP  POD #1  Subjective:  Michele Wu is a 41 y.o. [id number] s/p TAH, BS, Right oophorectomy/ovarian cystectomy.  Today she notes that she is doing ok.  She does note some pain/discomfort; however, per RN reports she has been hesitant to take pain medication.  She also reports mild dizziness.  No headache or blurry vision.  No fever or chills.  Tolerating diet.  No flatus, no BM.  Up voiding without difficulty.  Objective: Blood pressure 130/81, pulse 78, temperature 98.4 F (36.9 C), temperature source Oral, resp. rate 18, height 5' (1.524 m), weight 124 kg, last menstrual period 03/09/2022, SpO2 100 %.  Physical Exam:  General: alert, cooperative and no distress Chest: no respiratory distress Heart: regular rate and rhythm Abdomen: obese, soft, nontender, +BS Uterine Fundus: firm, appropriately tender Incision: Provena in place- clean and dry DVT Evaluation: No calf swelling or tenderness Extremities: no edema Skin: warm, dry  Results for orders placed or performed during the hospital encounter of 04/22/22 (from the past 24 hour(s))  CBC     Status: Abnormal   Collection Time: 04/23/22  5:48 AM  Result Value Ref Range   WBC 12.7 (H) 4.0 - 10.5 K/uL   RBC 3.06 (L) 3.87 - 5.11 MIL/uL   Hemoglobin 9.2 (L) 12.0 - 15.0 g/dL   HCT 27.8 (L) 36.0 - 46.0 %   MCV 90.8 80.0 - 100.0 fL   MCH 30.1 26.0 - 34.0 pg   MCHC 33.1 30.0 - 36.0 g/dL   RDW 14.1 11.5 - 15.5 %   Platelets 359 150 - 400 K/uL   nRBC 0.0 0.0 - 0.2 %  Basic metabolic panel     Status: Abnormal   Collection Time: 04/23/22  5:48 AM  Result Value Ref Range   Sodium 133 (L) 135 - 145 mmol/L   Potassium 4.4 3.5 - 5.1 mmol/L   Chloride 102 98 - 111 mmol/L   CO2 24 22 - 32 mmol/L   Glucose, Bld 162 (H) 70 - 99 mg/dL   BUN 14 6 - 20 mg/dL   Creatinine, Ser 0.88 0.44 - 1.00 mg/dL   Calcium 8.2 (L) 8.9 - 10.3 mg/dL   GFR, Estimated >60 >60 mL/min   Anion gap 7 5 - 15    Assessment/Plan: Michele Wu  is a 41 y.o. [id number] s/p TAH, BS, Right oophorectomy/ovarian cystectomy POD#1   -encouraged pt to take pain medication regularly -advance diet as tolerated -labs appropriate/stable following surgery -encourage ambulation -pt meeting milestones appropriately, plan for discharge home today  Janyth Pupa, DO Attending [place], [place] for [place]

## 2022-04-23 NOTE — Discharge Instructions (Addendum)
Post Operative Pain Med Plan:  >Take gabapentin 300 mg three times per day, as prescribed for 3 days, try to space them evenly  >Take the oxycodone- 1 tablet, on a schedule, around the clock, every 6 hours(set your phone alarm) for the first 2 days, there may be times when you will need 2 tablets but taking them on a schedule will decrease this need  >You can also take Tylenol together with the oxycodone.  If the oxycodone seems "to strong" then just take Tylenol  >Oxycodone will cause constipation, please be sure to take a stool softener (Colace) twice daily while taking this pain medication and/or continue this medication until your bowel regimen returns to normal  >Take the Toradol every 8 hours for the first 3 days then the remainder to supplement the pain as needed  >After the toradol is gone switch to Ibuprofen '600mg'$  every 6 hours as needed  If possible try to take the Toradol or Ibuprofen with food to help avoid upsetting your stomach  >Use a heating pad as well as needed  >I have also sent a prescription for zofran (ondansetron) for nausea to take if needed over the first couple of days  >Be gentle with your diet the first few days, liquids and soft non spicy food, fruits are great  >Get up and move, no lifting or straining   HOME INSTRUCTIONS  Please note any unusual or excessive bleeding, pain, swelling. Mild dizziness or drowsiness are normal for about 24 hours after surgery.   Shower when comfortable  Restrictions: No driving for 24 hours or while taking pain medications.  Activity:  No heavy lifting (> 10 lbs), nothing in vagina (no tampons, douching, or intercourse) x 4 weeks; no tub baths for 4 weeks Vaginal spotting is expected but if your bleeding is heavy, period like,  please call the office   Incision: Please keep the provena on for one week.  To remove, follow the Provena instructions.  If you have any concerns, please call the office.  Diet:  You may return  to your regular diet.  Do not eat large meals.  Eat small frequent meals throughout the day.  Continue to drink a good amount of water at least 6-8 glasses of water per day, hydration is very important for the healing process.  Pain Management: Follow instructions above  Always take prescription pain medication with food.  Be sure to take the stool softener colace, twice daily while taking the pain medication. Be sure to drink plenty of fluids and increase your fiber to help with constipation.  Alcohol -- Avoid for 24 hours and while taking pain medications.  Fever -- Call physician if temperature over 101 degrees  Follow up:  If you do not already have a follow up appointment scheduled, please call the office at [phone].  If you experience fever (a temperature greater than 100.4), pain unrelieved by pain medication, shortness of breath, swelling of a single leg, or any other symptoms which are concerning to you please the office immediately.

## 2022-04-24 ENCOUNTER — Encounter (HOSPITAL_COMMUNITY): Payer: Self-pay | Admitting: Obstetrics & Gynecology

## 2022-04-24 LAB — CYTOLOGY - NON PAP

## 2022-04-24 LAB — SURGICAL PATHOLOGY

## 2022-04-24 NOTE — Anesthesia Postprocedure Evaluation (Signed)
Anesthesia Post Note  Patient: Michele Wu  Procedure(s) Performed: HYSTERECTOMY ABDOMINAL (Abdomen) OPEN BILATERAL SALPINGECTOMY (Abdomen) RIGHT OOPHORECTOMY AND RIGHT OVARIAN CYST REMOVAL (Right: Abdomen)  Patient location during evaluation: Phase II Anesthesia Type: General Level of consciousness: awake Pain management: pain level controlled Vital Signs Assessment: post-procedure vital signs reviewed and stable Respiratory status: spontaneous breathing and respiratory function stable Cardiovascular status: blood pressure returned to baseline and stable Postop Assessment: no headache and no apparent nausea or vomiting Anesthetic complications: no Comments: Late entry   No notable events documented.   Last Vitals:  Vitals:   04/23/22 0128 04/23/22 0430  BP: 126/63 130/81  Pulse: 71 78  Resp: 18 18  Temp: 37 C 36.9 C  SpO2: 98% 100%    Last Pain:  Vitals:   04/23/22 0819  TempSrc:   PainSc: [place]

## 2022-04-24 NOTE — Discharge Summary (Signed)
Physician Discharge Summary  Patient ID: Michele Wu MRN: [id number] DOB/AGE: 03-14-1981 41 y.o.  Admit date: 04/22/2022 Discharge date: 04/23/2022  Admission Diagnoses:  Discharge Diagnoses:  Principal Problem:   Abnormal uterine bleeding (AUB) Active Problems:   Abnormal uterine bleeding   Discharged Condition: stable  Hospital Course: 41yo who presented for scheduled surgery.  She underwent TAH, BS, Right oophorectomy/ovarian cystectomy.  Please see operative noted for full report.  Her postop course was uncomplicated and she was discharged home in stable condition on POD#1  Consults: None  Significant Diagnostic Studies: labs:     Latest Ref Rng & Units 04/23/2022    5:48 AM 04/20/2022    8:53 AM 12/01/2021   10:45 AM  CBC  WBC 4.0 - 10.5 K/uL 12.7  6.9  8.0   Hemoglobin 12.0 - 15.0 g/dL 9.2  12.9  13.3   Hematocrit 36.0 - 46.0 % 27.8  38.7  39.5   Platelets 150 - 400 K/uL 359  290  325       Treatments: IV hydration, antibiotics: Ancef, analgesia: Toradol, and surgery: TAH, BS, Right oophorectomy/ovarian cystectomy  Discharge Exam: Blood pressure 130/81, pulse 78, temperature 98.4 F (36.9 C), temperature source Oral, resp. rate 18, height 5' (1.524 m), weight 124 kg, last menstrual period 03/09/2022, SpO2 100 %. General: alert, cooperative and no distress Chest: no respiratory distress Heart: regular rate and rhythm Abdomen: obese, soft, nontender, +BS Uterine Fundus: firm, appropriately tender Incision: Provena in place- clean and dry DVT Evaluation: No calf swelling or tenderness Extremities: no edema Skin: warm, dry  Disposition: Discharge disposition: 01-Home or Self Care        Allergies as of 04/23/2022       Reactions   Augmentin [amoxicillin-pot Clavulanate]    rash   Ciprofloxacin Nausea And Vomiting   Cymbalta [duloxetine Hcl]    Nausea    Macrobid [nitrofurantoin Macrocrystal]    Rash        Medication List     STOP taking  these medications    megestrol 40 MG tablet Commonly known as: MEGACE       TAKE these medications    acetaminophen 325 MG tablet Commonly known as: TYLENOL Take 2 tablets (650 mg total) by mouth every 6 (six) hours as needed for mild pain (temperature > 101.5.).   ALPRAZolam 1 MG tablet Commonly known as: XANAX Take 1 mg by mouth 2 (two) times daily as needed.   clotrimazole-betamethasone cream Commonly known as: Lotrisone Apply small amount to affected area twice daily as needed   docusate sodium 100 MG capsule Commonly known as: COLACE Take 1 capsule (100 mg total) by mouth 2 (two) times daily.   gabapentin 300 MG capsule Commonly known as: NEURONTIN Take 1 capsule (300 mg total) by mouth 3 (three) times daily for 3 days.   ibuprofen 600 MG tablet Commonly known as: ADVIL Take 1 tablet (600 mg total) by mouth every 6 (six) hours. What changed:  medication strength how much to take when to take this reasons to take this   losartan 50 MG tablet Commonly known as: COZAAR Take 50 mg by mouth daily. What changed: Another medication with the same name was removed. Continue taking this medication, and follow the directions you see here.   metoprolol succinate 50 MG 24 hr tablet Commonly known as: TOPROL-XL Take 50 mg by mouth daily.   omeprazole 20 MG capsule Commonly known as: PRILOSEC Take 20 mg by mouth daily.  ondansetron 4 MG tablet Commonly known as: Zofran Take 1 tablet (4 mg total) by mouth every 8 (eight) hours as needed for nausea or vomiting.   oxyCODONE 5 MG immediate release tablet Commonly known as: Oxy IR/ROXICODONE Take 1 tablet (5 mg total) by mouth every 6 (six) hours as needed for up to 7 days for moderate pain.   simvastatin 40 MG tablet Commonly known as: ZOCOR Take 40 mg by mouth at bedtime.        Follow-up Information     Janyth Pupa, DO Follow up.   Specialty: Obstetrics and Gynecology Why: As scheduled on July  12 Contact information: [place] [address] [phone]                 Signed: Annalee Genta 04/24/2022, 8:33 AM

## 2022-04-27 ENCOUNTER — Telehealth: Payer: Self-pay | Admitting: Obstetrics & Gynecology

## 2022-04-27 NOTE — Telephone Encounter (Signed)
Patient states she has a wound vac on and wants to know how to shower with it. Please advise.

## 2022-04-27 NOTE — Telephone Encounter (Signed)
Pt has a wound vac and was wondering how to shower. I spoke with Maudie Mercury B. Pt was advised to keep the pump outside of the shower, but the dressing can get wet. Pt voiced understanding. [place]

## 2022-04-29 ENCOUNTER — Encounter: Payer: Self-pay | Admitting: Obstetrics & Gynecology

## 2022-04-29 ENCOUNTER — Ambulatory Visit (INDEPENDENT_AMBULATORY_CARE_PROVIDER_SITE_OTHER): Payer: 59 | Admitting: Obstetrics & Gynecology

## 2022-04-29 VITALS — BP 125/83 | HR 87

## 2022-04-29 DIAGNOSIS — D3911 Neoplasm of uncertain behavior of right ovary: Secondary | ICD-10-CM

## 2022-04-29 DIAGNOSIS — Z4889 Encounter for other specified surgical aftercare: Secondary | ICD-10-CM

## 2022-04-29 NOTE — Progress Notes (Signed)
    PostOp Visit Note  Michele Wu is a 41 y.o. [id number] female who presents for a postoperative visit. She is 1 week postop following a TAH, right oophorectomy/cystectomy completed on 6/28   Today she notes that she is doing ok.  She does have some discomfort, but medication is helping. Denies fever or chills.  Tolerating gen diet.  +Flatus, Regular BMs.  Overall doing well and reports no acute complaints   Review of Systems Pertinent items are noted in HPI.    Objective:  BP 125/83 (BP Location: Right Arm, Patient Position: Sitting, Cuff Size: Normal)   Pulse 87   LMP 03/09/2022 (Exact Date) Comment: hcg negative 04/20/2022   Physical Examination:  GENERAL ASSESSMENT: well developed and well nourished SKIN: warm and dry CHEST: normal air exchange, respiratory effort normal with no retractions HEART: regular rate and rhythm ABDOMEN: soft, non-distended, +BS INCISION: Prevena removed- incision C/D/I- well healed, above incision ecchymosis noted EXTREMITY: no edema, no calf tenderness bilaterally  PSYCH: mood appropriate, normal affect       Assessment:    Postop check   Plan:   -meeting milestones appropriately -reviewed path report- borderline ovarian tumore  CA125 today  Discussed future removal of oophorectomy/omental sampling once recovered  Questions and concerns were addressed -f/u in 4wks  Janyth Pupa, DO Attending [place], [place] for [employer], [place]

## 2022-04-30 LAB — CA 125: Cancer Antigen (CA) 125: 128 U/mL — ABNORMAL HIGH (ref 0.0–38.1)

## 2022-05-04 DIAGNOSIS — I10 Essential (primary) hypertension: Secondary | ICD-10-CM | POA: Diagnosis not present

## 2022-05-04 DIAGNOSIS — E7849 Other hyperlipidemia: Secondary | ICD-10-CM | POA: Diagnosis not present

## 2022-05-04 DIAGNOSIS — R69 Illness, unspecified: Secondary | ICD-10-CM | POA: Diagnosis not present

## 2022-05-04 DIAGNOSIS — S39012A Strain of muscle, fascia and tendon of lower back, initial encounter: Secondary | ICD-10-CM | POA: Diagnosis not present

## 2022-05-04 DIAGNOSIS — Z6841 Body Mass Index (BMI) 40.0 and over, adult: Secondary | ICD-10-CM | POA: Diagnosis not present

## 2022-05-04 DIAGNOSIS — D4959 Neoplasm of unspecified behavior of other genitourinary organ: Secondary | ICD-10-CM | POA: Diagnosis not present

## 2022-05-04 DIAGNOSIS — K219 Gastro-esophageal reflux disease without esophagitis: Secondary | ICD-10-CM | POA: Diagnosis not present

## 2022-05-04 DIAGNOSIS — F419 Anxiety disorder, unspecified: Secondary | ICD-10-CM | POA: Diagnosis not present

## 2022-05-06 ENCOUNTER — Encounter: Payer: 59 | Admitting: Obstetrics & Gynecology

## 2022-05-26 ENCOUNTER — Telehealth: Payer: Self-pay | Admitting: Obstetrics & Gynecology

## 2022-05-26 DIAGNOSIS — Z90710 Acquired absence of both cervix and uterus: Secondary | ICD-10-CM | POA: Diagnosis not present

## 2022-05-26 DIAGNOSIS — R300 Dysuria: Secondary | ICD-10-CM | POA: Diagnosis not present

## 2022-05-26 DIAGNOSIS — Z7689 Persons encountering health services in other specified circumstances: Secondary | ICD-10-CM | POA: Diagnosis not present

## 2022-05-26 DIAGNOSIS — Z6841 Body Mass Index (BMI) 40.0 and over, adult: Secondary | ICD-10-CM | POA: Diagnosis not present

## 2022-05-26 NOTE — Telephone Encounter (Signed)
Pt has noticed blood in urine and pain. Started this am. I offered her to come in so I can check her urine but she is seeing her PCP this afternoon so she will mention it at that appt. [place]

## 2022-05-26 NOTE — Telephone Encounter (Signed)
Pt states she would like a call back concerning blood in urine and pain

## 2022-06-01 ENCOUNTER — Other Ambulatory Visit: Payer: Self-pay | Admitting: Obstetrics & Gynecology

## 2022-06-01 ENCOUNTER — Ambulatory Visit (INDEPENDENT_AMBULATORY_CARE_PROVIDER_SITE_OTHER): Payer: 59 | Admitting: Obstetrics & Gynecology

## 2022-06-01 ENCOUNTER — Encounter: Payer: Self-pay | Admitting: Obstetrics & Gynecology

## 2022-06-01 VITALS — BP 159/105 | HR 72 | Ht 61.0 in | Wt 268.2 lb

## 2022-06-01 DIAGNOSIS — M5442 Lumbago with sciatica, left side: Secondary | ICD-10-CM

## 2022-06-01 DIAGNOSIS — D3911 Neoplasm of uncertain behavior of right ovary: Secondary | ICD-10-CM

## 2022-06-01 DIAGNOSIS — R309 Painful micturition, unspecified: Secondary | ICD-10-CM | POA: Diagnosis not present

## 2022-06-01 DIAGNOSIS — R350 Frequency of micturition: Secondary | ICD-10-CM | POA: Diagnosis not present

## 2022-06-01 DIAGNOSIS — Z4889 Encounter for other specified surgical aftercare: Secondary | ICD-10-CM

## 2022-06-01 LAB — POCT URINALYSIS DIPSTICK
Glucose, UA: NEGATIVE
Ketones, UA: NEGATIVE
Nitrite, UA: NEGATIVE
Protein, UA: NEGATIVE

## 2022-06-01 NOTE — Progress Notes (Signed)
    PostOp Visit Note  Michele Wu is a 41 y.o. G23P2012 female who presents for a postoperative visit. She is 6 weeks postop following a TAH right oophorectomy completed on 04/22/2022   Pain with urination that has progressed past couple of weeks and low back pain. Denies fever or chills.  Tolerating gen diet.  +Flatus, Regular BMs.  Denies pelvic pain. She has noted some light spotting.   Review of Systems Pertinent items are noted in HPI.    Objective:  BP (!) 159/105 (BP Location: Left Arm, Patient Position: Sitting, Cuff Size: Normal)   Pulse 72   Ht '5\' 1"'$  (1.549 m)   Wt 268 lb 3.2 oz (121.7 kg)   LMP 03/09/2022 (Exact Date) Comment: hcg negative 04/20/2022  BMI 50.68 kg/m    Physical Examination:  GENERAL ASSESSMENT: well developed and well nourished SKIN: normal color, no lesions CHEST: normal air exchange, respiratory effort normal with no retractions HEART: regular rate and rhythm ABDOMEN: soft, non-distended, +BS INCISION: well healed- C/D/I GU: normal external genitalia, pink vaginal mucosa- no discharge or bleeding noted.  On bimanual exam at ~ 0.5cm defect noted in vaginal cuff- bleeding noted with palpation.  Larger speculum re-inserted, no dehiscence appreciated, small pea-sized defect noted- no active bleeding EXTREMITY: no edema, no calf tenderness bilaterally PSYCH: mood appropriate, normal affect       Assessment:    Postop visit   Plan:   -Suspected UTI  UA and culture collected, will treat accordingly -Vaginal spotting  Suspect due to granulation tissue with possible small defect  Continue pelvic rest, f/u in 2wks -Borderline ovarian Ca  Reviewed pathology report  Referral to Gyn/onc for next step which will likely be left oophorectomy and omental biopsy  Janyth Pupa, DO Attending [place], [place] for [employer], [place]

## 2022-06-02 ENCOUNTER — Telehealth: Payer: Self-pay | Admitting: *Deleted

## 2022-06-02 LAB — MICROSCOPIC EXAMINATION
Bacteria, UA: NONE SEEN
Casts: NONE SEEN /lpf
WBC, UA: >30 /hpf — AB (ref 0–5)

## 2022-06-02 LAB — URINALYSIS, ROUTINE W REFLEX MICROSCOPIC
Bilirubin, UA: NEGATIVE
Glucose, UA: NEGATIVE
Ketones, UA: NEGATIVE
Nitrite, UA: NEGATIVE
Protein,UA: NEGATIVE
Specific Gravity, UA: 1.016 (ref 1.005–1.030)
Urobilinogen, Ur: 0.2 mg/dL (ref 0.2–1.0)
pH, UA: 6 (ref 5.0–7.5)

## 2022-06-02 NOTE — Telephone Encounter (Signed)
Spoke with the patient regarding the referral to GYN oncology. Patient scheduled as new patient with Dr Berline Lopes on 8/14 at 11:15 am. Patient given an arrival time of 10:45 am.  Explained to the patient the the doctor will perform a pelvic exam at this visit. Patient given the policy that no visitors under the 16 yrs are a loud in the [facility]. Patient given the address/phone number for the clinic and that the center offers free valet service.

## 2022-06-02 NOTE — Telephone Encounter (Signed)
Attempted to reach the patient to schedule a new patient appt with Dr Berline Lopes; [facility] to call the office back

## 2022-06-03 LAB — URINE CULTURE: Organism ID, Bacteria: NO GROWTH

## 2022-06-04 ENCOUNTER — Encounter: Payer: Self-pay | Admitting: Gynecologic Oncology

## 2022-06-04 NOTE — Progress Notes (Signed)
Called patient to review meaningful use prior to new pt appt with Dr. Berline Lopes - pt states that she doesn't know any of her family history.

## 2022-06-07 NOTE — Progress Notes (Signed)
GYNECOLOGIC ONCOLOGY NEW PATIENT CONSULTATION   Patient Name: Michele Wu  Patient Age: 41 y.o. 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   Patient is overall doing well after surgery although recovering slowly.  There was concern for granulation tissue versus small cuff dehiscence at her last visit.  She sees her OB/GYN for follow-up next week.  I do not see evidence of cuff dehiscence on my exam today.  We discussed pathology from surgery, which showed a borderline tumor of a para-tubal cyst on the right.  Cytology from pelvic washings were negative for any borderline or malignant cells and all additional tissue removed was negative for borderline tumor.  I reviewed that this diagnosis does not require additional treatment after surgerym such as chemotherapy does in the setting of ovarian or fallopian tube cancer.  There is, though, a risk of recurrence of borderline tumors.  Rarely, these can recur as invasive carcinomas.  After childbearing has been completed and once the patient is closer to natural age of menopause, recommendation is for completion surgery with bilateral salpingo-oophorectomy if this has not already been performed.  The patient did not undergo staging at the time of her surgery as this was an incidental diagnosis on final pathology.  I do not feel strongly that the patient requires urgent surgery for this to be done.  I have recommended we get a CT scan of the abdomen and pelvis to evaluate for any evidence of metastatic disease.  As this is negative, as I presume it will be, and given normal findings at the time of surgery, I think it is very reasonable to delay completion surgery for at least 3-6 months.  The patient very much wishes to focus on recovery from her recent  surgery before having a second procedure.  She had previously been noted to have a cystic mass on the left ovary.  The left ovary was described as normal in appearance at the time of surgery.  If any persistent evidence of adnexal cyst/mass on CT scan, we will plan for repeat pelvic ultrasound in 3 months when I see her for follow-up if we are not planning to schedule and move forward with surgery in the near future.  Patient had a CA-125 prior to surgery.  Although this was normal, the CA-125 was 30.  This was repeated shortly after the surgery and was elevated.  I suspect that this was solely in the setting of recent surgery and associated inflammation.  We will plan to repeat a CA-125 at her follow-up visit in 3 months.   I encouraged the patient to keep her visit with Dr. Nelda Marseille next week and to continue on pelvic rest.   A copy of this note was sent to the patient's referring provider.   65 minutes of total time was spent for this patient encounter, including preparation, face-to-face counseling with the patient and coordination of care, and documentation of the encounter.   Jeral Pinch, MD  Division of Gynecologic Oncology  Department of Obstetrics and Gynecology  [facility]  ___________________________________________  Chief Complaint: Chief Complaint  Patient presents with   Ovarian tumor of borderline malignancy, right    History of Present Illness:  Michele Wu is a 41 y.o. y.o. female who is seen in consultation at the request of  Dr. Nelda Marseille for an evaluation of serous borderline tumor of a para-tubal cyst.  Patient developed irregular menses in December 2022 with abnormal uterine bleeding.  Would miss several months between menses, when she would bleeding, menses often lasted much longer and were heavier.  Pelvic ultrasound on 12/05/2021 revealed uterus measuring 7.9 x 3.3 x 5.3 cm.  Endometrial lining 4 mm.  Right ovary measures up to 3 cm  and is normal in appearance.  Left ovary measures up to 3.4 cm.  Complicated cyst within the left ovary measures up to 3.3 cm containing multiple thin septations, no discrete mural nodules.  Complicated cystic lesion identified in the right adnexa, superior to the right ovary measuring up to 4.2 cm with an avascular, intracystic mural nodule measuring 14 x 6 x 11 mm.  Repeat pelvic ultrasound exam on 02/10/2022 reveals right ovary measures up to 2.6 cm and is normal in appearance.  There is a 4.1 x 3.1 x 3.8 cm complex right adnexal cyst with known vascular nodule measuring up to 1 cm.  Left ovary measures up to 3.1 cm with a septated complex left ovarian cyst measuring 2.3 x 1.8 x 1.1 cm.  In 02/2022, ROMA low risk. CA-125 was 30.7.  On 6/28, the patient underwent TAH/BS, right ovarian cystectomy and oophorectomy.  Findings at the time of surgery were a normal uterus and bilateral fallopian tubes. 4cm paratubal right ovarian cyst.  Normal left ovary.  CA-125 on 7/5 was 128.  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 Cytology was negative.   Patient was seen most recently on 8/7.  She was having some bladder symptoms, UA and culture were collected without evidence of urinary tract infection.  Some granulation tissue with possible small defect along the cuff was noted.  Plan was for pelvic rest and follow-up in 2 weeks.  Today, the patient reports still having pain with urination.  She also endorses intermittent cramping like menstrual cramping.  She reports baseline bowel function with a bowel movement every 1-2 days.  Denies any pain with bowel movements.  She has had some light intermittent vaginal spotting, denies any vaginal discharge.  Reports a good appetite without nausea or emesis.  Continues to have significant fatigue.  Does not feel ready to go back to work.  PAST MEDICAL HISTORY:  Past Medical History:  Diagnosis Date   Anxiety    Chest pain     Complication of anesthesia    Depression    Dysuria    GERD (gastroesophageal reflux disease)    High cholesterol    Hypertension    Low back pain    PONV (postoperative nausea and vomiting)      PAST SURGICAL HISTORY:  Past Surgical History:  Procedure Laterality Date   ABDOMINAL HYSTERECTOMY N/A 04/22/2022   Procedure: HYSTERECTOMY ABDOMINAL;  Surgeon: Janyth Pupa, DO;  Location: [facility];  Service: Gynecology;  Laterality: N/A;   BILATERAL SALPINGECTOMY N/A 04/22/2022   Procedure: OPEN BILATERAL SALPINGECTOMY;  Surgeon: Janyth Pupa, DO;  Location: [facility];  Service: Gynecology;  Laterality: N/A;   CESAREAN SECTION     x2   CHOLECYSTECTOMY     OOPHORECTOMY Right 04/22/2022   Procedure: RIGHT OOPHORECTOMY AND RIGHT OVARIAN CYST REMOVAL;  Surgeon: Janyth Pupa, DO;  Location: [facility];  Service: Gynecology;  Laterality: Right;    OB/GYN HISTORY:  OB History  Gravida Para Term Preterm AB Living  '3 2 2   1 '$ 2  SAB IAB Ectopic Multiple Live Births  1            # Outcome Date GA Lbr Len/2nd Weight Sex Delivery Anes PTL Lv  3 SAB           2 Term      CS-LTranv     1 Term      CS-LTranv       Patient's last menstrual period was 03/09/2022 (exact date).  Age at menarche: 63 Age at menopause: n/a Hx of HRT: n/a Hx of STDs: Denies Last pap: 11/2021 - AGC, HR HPV negative History of abnormal pap smears: Denies  SCREENING STUDIES:  Last mammogram: 2022  Last colonoscopy: Has never had  MEDICATIONS: Outpatient Encounter Medications as of 06/08/2022  Medication Sig   acetaminophen (TYLENOL) 325 MG tablet Take 2 tablets (650 mg total) by mouth every 6 (six) hours as needed for mild pain (temperature > 101.5.).   ALPRAZolam (XANAX) 1 MG tablet Take 1 mg by mouth 2 (two) times daily as needed.   docusate sodium (COLACE) 100 MG capsule Take 1 capsule (100 mg total) by mouth 2 (two) times daily.   ibuprofen (ADVIL) 600 MG tablet Take 1 tablet (600 mg total) by mouth every  6 (six) hours.   losartan (COZAAR) 50 MG tablet Take 50 mg by mouth daily.   metoprolol succinate (TOPROL-XL) 50 MG 24 hr tablet Take 50 mg by mouth daily.   nystatin (MYCOSTATIN/NYSTOP) powder Apply 1 Application topically 3 (three) times daily.   omeprazole (PRILOSEC) 20 MG capsule Take 20 mg by mouth daily.   simvastatin (ZOCOR) 40 MG tablet Take 40 mg by mouth at bedtime.   gabapentin (NEURONTIN) 300 MG capsule Take 1 capsule (300 mg total) by mouth 3 (three) times daily for 3 days.   No facility-administered encounter medications on file as of 06/08/2022.    ALLERGIES:  Allergies  Allergen Reactions   Augmentin [Amoxicillin-Pot Clavulanate]     rash   Bactrim [Sulfamethoxazole-Trimethoprim] Nausea And Vomiting   Ciprofloxacin Nausea And Vomiting   Cymbalta [Duloxetine Hcl]     Nausea    Macrobid [Nitrofurantoin Macrocrystal]     Rash      FAMILY HISTORY:  Family History  Adopted: Yes  Family history unknown: Yes     SOCIAL HISTORY:  Social Connections: Moderately Isolated (12/01/2021)   Social Connection and Isolation Panel [NHANES]    Frequency of Communication with Friends and Family: More than three times a week    Frequency of Social Gatherings with Friends and Family: More than three times a week    Attends Religious Services: Never    Marine scientist or Organizations: No    Attends Music therapist: Never    Marital Status: Married    REVIEW OF SYSTEMS:  + fatigue, dysuria, urinary frequency, blood in urine, pelvic pain, back pain, muscle pain, depression Denies appetite changes, fevers, chills, unexplained weight changes. Denies hearing loss, neck lumps or masses, mouth sores, ringing in ears or voice changes. Denies cough or wheezing.  Denies shortness of breath. Denies chest pain or palpitations. Denies leg swelling. Denies abdominal distention, pain, blood in stools, constipation, diarrhea, nausea, vomiting, or early satiety. Denies hot  flashes or vaginal discharge.   Denies joint pain. Denies itching, rash, or wounds. Denies dizziness, headaches, numbness or seizures. Denies swollen lymph nodes or glands, denies easy bruising or bleeding. Denies anxiety, confusion, or decreased concentration.  Physical Exam:  Vital Signs for this  encounter:  Blood pressure (!) 146/103, pulse 74, temperature 98.5 F (36.9 C), temperature source Oral, resp. rate 18, height '5\' 1"'$  (1.549 m), weight 264 lb (119.7 kg), last menstrual period 03/09/2022, SpO2 100 %. Body mass index is 49.88 kg/m. General: Alert, oriented, no acute distress.  HEENT: Normocephalic, atraumatic. Sclera anicteric.  Chest: Clear to auscultation bilaterally. No wheezes, rhonchi, or rales. Cardiovascular: Regular rate and rhythm, no murmurs, rubs, or gallops.  Abdomen: Obese. Normoactive bowel sounds. Soft, nondistended, nontender to palpation. No masses or hepatosplenomegaly appreciated. No palpable fluid wave.  Well-healed Pfannenstiel incision.  Erythema around the incision and in bilateral groins consistent with Candida. Extremities: Grossly normal range of motion. Warm, well perfused. No edema bilaterally.  Skin: No rashes or lesions.  Lymphatics: No cervical, supraclavicular, or inguinal adenopathy.  GU:  Normal external female genitalia. No lesions. No discharge or bleeding.             Bladder/urethra:  No lesions or masses, well supported bladder             Vagina: Minimal brown-tinged discharge.             Cervix/uterus: Surgically absent.  Cuff is difficult to view on speculum exam, but what I am able to see appears intact with an area of what I suspect is granulation tissue.  On bimanual exam, cuff is minimally tender, no fluctuance appreciated.  No defect that I can palpate.             Adnexa: No masses appreciated.  LABORATORY AND RADIOLOGIC DATA:  Outside medical records were reviewed to synthesize the above history, along with the history and  physical obtained during the visit.   Lab Results  Component Value Date   WBC 12.7 (H) 04/23/2022   HGB 9.2 (L) 04/23/2022   HCT 27.8 (L) 04/23/2022   PLT 359 04/23/2022   GLUCOSE 162 (H) 04/23/2022   ALT 17 04/20/2022   AST 14 (L) 04/20/2022   NA 133 (L) 04/23/2022   K 4.4 04/23/2022   CL 102 04/23/2022   CREATININE 0.88 04/23/2022   BUN 14 04/23/2022   CO2 24 04/23/2022   TSH 2.100 12/01/2021   Pathology 6/28: A. CYST, OVARY, PERITUBAL, RIGHT, BIOPSY:  - Serous borderline tumor involving a 4.5 cm cyst.  - No surface involvement identified.  - See oncology table and comment.   B. UTERUS, CERVIX, FALLOPIAN TUBE, LEFT, HYSTERECTOMY, SALPINGECTOMY:  - Cervix      Focal squamous metaplasia      Negative for dysplasia or malignancy.  - Endometrium      Inactive with breakdown and degenerative changes.      Negative for hyperplasia or malignancy.  - Myometrium      Unremarkable.      Negative for malignancy.  - Uterine serosa      Unremarkable.      Negative for borderline tumor or malignancy.  - Left Fallopian tube      Findings consistent with tubal ligation.      Negative for endometriosis or malignancy.   C. OVARY, FALLOPIAN TUBE, RIGHT, SALPINGO OOPHORECTOMY:  - Right ovary      Multiple benign serous and follicular cysts.      Negative for borderline tumor or malignancy.   ONCOLOGY TABLE:  OVARY or FALLOPIAN TUBE or PRIMARY PERITONEUM: Resection  Procedure: Cyst excision, hysterectomy bilateral salpingectomy  Specimen Integrity: Intact  Tumor Site: See comment  Tumor Size: 1.3 cm and a 4.5 cm cyst  Histologic Type: Serous borderline tumor  Histologic Grade: Low-grade  Ovarian Surface Involvement: Not identified  Fallopian Tube Surface Involvement: Not identified  Implants: Not identified  Other Tissue/ Organ Involvement: Not identified  Largest Extrapelvic Peritoneal Focus: Not applicable  Peritoneal/Ascitic Fluid Involvement: Pending  Chemotherapy  Response Score (CRS): No known presurgical therapy  Regional Lymph Nodes: No lymph nodes submitted  Pathologic Stage Classification (pTNM, AJCC 8th Edition): pT1a, pN not  assigned  Ancillary Studies: Can be performed upon request  Representative Tumor Block: A1  Comment(s): The right peritubal ovarian cyst is a 4.5 cm cyst with a 1.3  cm luminal nodule within the cyst with features of serous borderline  tumor.  No invasion of the wall of the cyst or involvement of the  surface of the cyst is present.  The wall of the cyst is thin and the  borderline tumor could be fallopian tube in origin.  There is a portion  of fallopian tube attached to the cyst which has suture material with  foreign body reaction likely secondary to previous tubal ligation.

## 2022-06-08 ENCOUNTER — Encounter: Payer: Self-pay | Admitting: Gynecologic Oncology

## 2022-06-08 ENCOUNTER — Inpatient Hospital Stay: Payer: 59 | Attending: Gynecologic Oncology | Admitting: Gynecologic Oncology

## 2022-06-08 ENCOUNTER — Other Ambulatory Visit: Payer: Self-pay

## 2022-06-08 VITALS — BP 146/103 | HR 74 | Temp 98.5°F | Resp 18 | Ht 61.0 in | Wt 264.0 lb

## 2022-06-08 DIAGNOSIS — D3911 Neoplasm of uncertain behavior of right ovary: Secondary | ICD-10-CM

## 2022-06-08 DIAGNOSIS — R5383 Other fatigue: Secondary | ICD-10-CM | POA: Diagnosis not present

## 2022-06-08 DIAGNOSIS — R309 Painful micturition, unspecified: Secondary | ICD-10-CM | POA: Diagnosis not present

## 2022-06-08 DIAGNOSIS — Z6841 Body Mass Index (BMI) 40.0 and over, adult: Secondary | ICD-10-CM | POA: Diagnosis not present

## 2022-06-08 DIAGNOSIS — Z90710 Acquired absence of both cervix and uterus: Secondary | ICD-10-CM | POA: Insufficient documentation

## 2022-06-08 DIAGNOSIS — Z9079 Acquired absence of other genital organ(s): Secondary | ICD-10-CM | POA: Diagnosis not present

## 2022-06-08 DIAGNOSIS — N939 Abnormal uterine and vaginal bleeding, unspecified: Secondary | ICD-10-CM | POA: Diagnosis not present

## 2022-06-08 DIAGNOSIS — B379 Candidiasis, unspecified: Secondary | ICD-10-CM

## 2022-06-08 DIAGNOSIS — B3789 Other sites of candidiasis: Secondary | ICD-10-CM | POA: Diagnosis not present

## 2022-06-08 MED ORDER — NYSTATIN 100000 UNIT/GM EX POWD: 1.0000 | Freq: Three times a day (TID) | CUTANEOUS | 1 refills | Status: DC

## 2022-06-08 NOTE — Patient Instructions (Addendum)
It was very nice to meet you today.  Please follow-up with Dr. Nelda Marseille about clearance to go back to work and when you will be off of pelvic rest.  We talked about findings from the time of surgery of a borderline tumor involving a cyst next to the right fallopian tube.  None of the other pelvic organs removed showed any borderline tumor.  Borderline tumor has a risk of recurrence, similar to cancer, but does not need treatment like cancer does after surgery.  Ideally, given your age and that you have finished with childbearing, we would move forward with removing the other ovary and taking some biopsies.  Because you are still recovering from your recent surgery, I think it is reasonable to delay a second surgery for several months.  I will plan to see you for follow-up in 3 months and, if you are feeling better, we can discuss date for surgery at that time.  In the meantime, I have ordered a CT scan to make sure that we are not seeing anything in your abdomen or pelvis that would be worrisome in the setting of what we know now about your borderline tumor.  If you develop any new and concerning symptoms between now and your follow-up visit with me in 3 months, please reach out to my clinic at [phone].  I sent a prescription for nystatin powder to your pharmacy.  This will help treat your yeast infection.

## 2022-06-12 ENCOUNTER — Ambulatory Visit (HOSPITAL_COMMUNITY)
Admission: RE | Admit: 2022-06-12 | Discharge: 2022-06-12 | Disposition: A | Discharge status: Home / Self Care | Payer: 59 | Source: Ambulatory Visit | Attending: Gynecologic Oncology | Admitting: Gynecologic Oncology

## 2022-06-12 DIAGNOSIS — D3911 Neoplasm of uncertain behavior of right ovary: Secondary | ICD-10-CM | POA: Diagnosis not present

## 2022-06-12 DIAGNOSIS — Z9889 Other specified postprocedural states: Secondary | ICD-10-CM | POA: Diagnosis not present

## 2022-06-12 DIAGNOSIS — C561 Malignant neoplasm of right ovary: Secondary | ICD-10-CM | POA: Diagnosis not present

## 2022-06-12 DIAGNOSIS — Z90710 Acquired absence of both cervix and uterus: Secondary | ICD-10-CM | POA: Diagnosis not present

## 2022-06-12 MED ORDER — IOHEXOL 300 MG/ML  SOLN
100.0000 mL | Freq: Once | INTRAMUSCULAR | Status: AC | PRN
  Administered 2022-06-12: 100 mL via INTRAVENOUS

## 2022-06-12 MED ORDER — SODIUM CHLORIDE (PF) 0.9 % IJ SOLN
INTRAMUSCULAR | Status: AC
  Filled 2022-06-12: qty 50

## 2022-06-16 ENCOUNTER — Other Ambulatory Visit: Payer: Self-pay | Admitting: Gynecologic Oncology

## 2022-06-16 ENCOUNTER — Encounter: Payer: Self-pay | Admitting: Obstetrics & Gynecology

## 2022-06-16 ENCOUNTER — Ambulatory Visit (INDEPENDENT_AMBULATORY_CARE_PROVIDER_SITE_OTHER): Payer: 59 | Admitting: Obstetrics & Gynecology

## 2022-06-16 VITALS — BP 169/101 | HR 68 | Ht 61.0 in | Wt 268.0 lb

## 2022-06-16 DIAGNOSIS — B379 Candidiasis, unspecified: Secondary | ICD-10-CM

## 2022-06-16 DIAGNOSIS — D3911 Neoplasm of uncertain behavior of right ovary: Secondary | ICD-10-CM

## 2022-06-16 DIAGNOSIS — Z4889 Encounter for other specified surgical aftercare: Secondary | ICD-10-CM

## 2022-06-16 DIAGNOSIS — L304 Erythema intertrigo: Secondary | ICD-10-CM

## 2022-06-16 MED ORDER — NYSTATIN 100000 UNIT/GM EX POWD: Freq: Two times a day (BID) | CUTANEOUS | 0 refills | Status: DC

## 2022-06-16 MED ORDER — CLOTRIMAZOLE-BETAMETHASONE 1-0.05 % EX CREA: 1.0000 | TOPICAL_CREAM | Freq: Two times a day (BID) | CUTANEOUS | 2 refills | Status: DC | PRN

## 2022-06-16 NOTE — Progress Notes (Unsigned)
   GYN VISIT Patient name: Michele Wu MRN [id number]  Date of birth: 06-Dec-1980 Chief Complaint:   Follow-up  History of Present Illness:   Michele Wu is a 41 y.o. [id number] s/p TAH, right oophorectomy on 04/22/22 being seen today for postop follow up.    At her last visit, she had reports some vaginal spotting on exam there was concern regarding complete healing of vaginal cuff.  Today she notes that the bleeding has resolved.  She denies pelvic or abdominal pain.  She is still having intermittent back pain on her left; however UA and culture were negative.  Denies fever/chills.  No other acute complaints and overall feeling much better than previously.   Pt has been seen by Dr. Berline Lopes- follow up in November to review additional surgical intervention regarding borderline ovarian Ca.  Patient's last menstrual period was 03/09/2022 (exact date).     12/01/2021    9:53 AM  Depression screen PHQ 2/9  Decreased Interest 0  Down, Depressed, Hopeless 0  PHQ - 2 Score 0     Review of Systems:   Pertinent items are noted in HPI Denies fever/chills, dizziness, headaches, visual disturbances, fatigue, shortness of breath, chest pain, abdominal pain, vomiting. Pertinent History Reviewed:  Reviewed past medical,surgical, social, obstetrical and family history.  Reviewed problem list, medications and allergies. Physical Assessment:   Vitals:   06/16/22 1426  BP: (!) 169/101  Pulse: 68  Weight: 268 lb (121.6 kg)  Height: '5\' 1"'$  (1.549 m)  Body mass index is 50.64 kg/m.       Physical Examination:   General appearance: alert, well appearing, and in no distress  Psych: mood appropriate, normal affect  Skin: warm & dry   Cardiovascular: normal heart rate noted  Respiratory: normal respiratory effort, no distress  Abdomen: obese, soft, non-tender, incision well healed.  Still noting irritation under pannus and groin area  Back: Notes tenderness along paraspinal  muscles Pelvic: normal external genitalia, on bimanual exam- cuff well healed- no bleeding noted  Extremities: no calf tenderness bilaterally  Chaperone:  Dr. Zigmund Daniel     Assessment & Plan:  1) Postop visit -granulation tissue has improved, cuff now well-healed -released to regular activity, ok to have intercourse  2) Back pain -suspect pain may be musculoskeletal in nature- reviewed conservative options -should this continue plan to recheck UA and/or follow up with PCP  3) Borderline ovarian ca -management per Dr. Berline Lopes  4) Intertrigo -continue with either Nystatin powder and/or Lotrisone as previously prescribed and resent in today  Meds ordered this encounter  Medications   clotrimazole-betamethasone (LOTRISONE) cream    Sig: Apply 1 Application topically 2 (two) times daily as needed.    Dispense:  30 g    Refill:  2      Return in about 1 year (around 06/17/2023) for Annual.   Janyth Pupa, DO Attending [place], [place] for [facility], [place]

## 2022-06-18 DIAGNOSIS — Z6841 Body Mass Index (BMI) 40.0 and over, adult: Secondary | ICD-10-CM | POA: Diagnosis not present

## 2022-06-18 DIAGNOSIS — I10 Essential (primary) hypertension: Secondary | ICD-10-CM | POA: Diagnosis not present

## 2022-06-18 DIAGNOSIS — R300 Dysuria: Secondary | ICD-10-CM | POA: Diagnosis not present

## 2022-07-06 DIAGNOSIS — L239 Allergic contact dermatitis, unspecified cause: Secondary | ICD-10-CM | POA: Diagnosis not present

## 2022-07-06 DIAGNOSIS — R030 Elevated blood-pressure reading, without diagnosis of hypertension: Secondary | ICD-10-CM | POA: Diagnosis not present

## 2022-07-06 DIAGNOSIS — Z6841 Body Mass Index (BMI) 40.0 and over, adult: Secondary | ICD-10-CM | POA: Diagnosis not present

## 2022-07-29 DIAGNOSIS — Z111 Encounter for screening for respiratory tuberculosis: Secondary | ICD-10-CM | POA: Diagnosis not present

## 2022-09-03 ENCOUNTER — Encounter: Payer: Self-pay | Admitting: Gynecologic Oncology

## 2022-09-08 ENCOUNTER — Other Ambulatory Visit: Payer: Self-pay

## 2022-09-08 ENCOUNTER — Telehealth: Payer: Self-pay

## 2022-09-08 ENCOUNTER — Inpatient Hospital Stay: Payer: 59 | Attending: Gynecologic Oncology | Admitting: Gynecologic Oncology

## 2022-09-08 ENCOUNTER — Encounter: Payer: Self-pay | Admitting: Gynecologic Oncology

## 2022-09-08 ENCOUNTER — Inpatient Hospital Stay: Payer: 59

## 2022-09-08 VITALS — BP 156/89 | HR 72 | Temp 98.7°F | Resp 16 | Wt 268.9 lb

## 2022-09-08 DIAGNOSIS — Z90721 Acquired absence of ovaries, unilateral: Secondary | ICD-10-CM | POA: Diagnosis not present

## 2022-09-08 DIAGNOSIS — I10 Essential (primary) hypertension: Secondary | ICD-10-CM | POA: Insufficient documentation

## 2022-09-08 DIAGNOSIS — R232 Flushing: Secondary | ICD-10-CM

## 2022-09-08 DIAGNOSIS — R971 Elevated cancer antigen 125 [CA 125]: Secondary | ICD-10-CM | POA: Insufficient documentation

## 2022-09-08 DIAGNOSIS — E7800 Pure hypercholesterolemia, unspecified: Secondary | ICD-10-CM | POA: Insufficient documentation

## 2022-09-08 DIAGNOSIS — N951 Menopausal and female climacteric states: Secondary | ICD-10-CM | POA: Diagnosis not present

## 2022-09-08 DIAGNOSIS — D3911 Neoplasm of uncertain behavior of right ovary: Secondary | ICD-10-CM

## 2022-09-08 DIAGNOSIS — K219 Gastro-esophageal reflux disease without esophagitis: Secondary | ICD-10-CM | POA: Insufficient documentation

## 2022-09-08 DIAGNOSIS — Z79899 Other long term (current) drug therapy: Secondary | ICD-10-CM | POA: Diagnosis not present

## 2022-09-08 DIAGNOSIS — Z6841 Body Mass Index (BMI) 40.0 and over, adult: Secondary | ICD-10-CM | POA: Insufficient documentation

## 2022-09-08 DIAGNOSIS — Z90710 Acquired absence of both cervix and uterus: Secondary | ICD-10-CM

## 2022-09-08 DIAGNOSIS — N898 Other specified noninflammatory disorders of vagina: Secondary | ICD-10-CM

## 2022-09-08 DIAGNOSIS — R300 Dysuria: Secondary | ICD-10-CM

## 2022-09-08 NOTE — Telephone Encounter (Signed)
LVM for patient to call office regarding potential surgery dates. Per Michele John NP, surgery dates for January are 1/18 or 1/31. Pt needs to let us know which works for her.

## 2022-09-08 NOTE — Patient Instructions (Signed)
It was good to see you today.  We will work to get surgery tentatively scheduled for January.  I will see you back in early January to discuss the details of surgery.  We will plan to get a CA-125 today.  That was the lab test that you had before surgery and again in July.  I will let you know once I have the results from your ultrasound.  I suspect that you may have lost some function of your remaining ovary, which has caused you to start having some hot flashes and vaginal dryness.  If you change your mind about trying some hormone replacement therapy before your visit with me in January, please let me know.

## 2022-09-08 NOTE — Progress Notes (Signed)
Gynecologic Oncology Return Clinic Visit  09/08/22  Reason for Visit: Surveillance in the setting of a borderline tumor  Treatment History: Patient developed irregular menses in December 2022 with abnormal uterine bleeding.  Would miss several months between menses, when she would bleeding, menses often lasted much longer and were heavier.   Pelvic ultrasound on 12/05/2021 revealed uterus measuring 7.9 x 3.3 x 5.3 cm.  Endometrial lining 4 mm.  Right ovary measures up to 3 cm and is normal in appearance.  Left ovary measures up to 3.4 cm.  Complicated cyst within the left ovary measures up to 3.3 cm containing multiple thin septations, no discrete mural nodules.  Complicated cystic lesion identified in the right adnexa, superior to the right ovary measuring up to 4.2 cm with an avascular, intracystic mural nodule measuring 14 x 6 x 11 mm.   Repeat pelvic ultrasound exam on 02/10/2022 reveals right ovary measures up to 2.6 cm and is normal in appearance.  There is a 4.1 x 3.1 x 3.8 cm complex right adnexal cyst with known vascular nodule measuring up to 1 cm.  Left ovary measures up to 3.1 cm with a septated complex left ovarian cyst measuring 2.3 x 1.8 x 1.1 cm.   In 02/2022, ROMA low risk. CA-125 was 30.7.   On 6/28, the patient underwent TAH/BS, right ovarian cystectomy and oophorectomy.  Findings at the time of surgery were a normal uterus and bilateral fallopian tubes. 4cm paratubal right ovarian cyst.  Normal left ovary.   CA-125 on 7/5 was 128.   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 Cytology was negative.   CT A/P on 06/12/2022 revealed no evidence of adenopathy or metastatic disease.  Postsurgical changes noted.  Interval History: Overall doing well.  Notes that for several weeks, she has intermittent sharp left pelvic and back pain.  Uses ibuprofen and Tylenol as needed for this pain.  Reports normal bowel function.  Continues to have  some pain when she needs to urinate.  This started right after surgery.  Pain goes away after she empties her bladder.  Denies other urinary symptoms.  Endorses a good appetite without nausea or emesis.  Has begun having some hot flashes since surgery as well as vaginal dryness.  Past Medical/Surgical History: Past Medical History:  Diagnosis Date   Anxiety    Chest pain    Complication of anesthesia    Depression    Dysuria    GERD (gastroesophageal reflux disease)    High cholesterol    Hypertension    Low back pain    PONV (postoperative nausea and vomiting)     Past Surgical History:  Procedure Laterality Date   ABDOMINAL HYSTERECTOMY N/A 04/22/2022   Procedure: HYSTERECTOMY ABDOMINAL;  Surgeon: Janyth Pupa, DO;  Location: [facility];  Service: Gynecology;  Laterality: N/A;   BILATERAL SALPINGECTOMY N/A 04/22/2022   Procedure: OPEN BILATERAL SALPINGECTOMY;  Surgeon: Janyth Pupa, DO;  Location: [facility];  Service: Gynecology;  Laterality: N/A;   CESAREAN SECTION     x2   CHOLECYSTECTOMY     OOPHORECTOMY Right 04/22/2022   Procedure: RIGHT OOPHORECTOMY AND RIGHT OVARIAN CYST REMOVAL;  Surgeon: Janyth Pupa, DO;  Location: [facility];  Service: Gynecology;  Laterality: Right;    Family History  Adopted: Yes  Family history unknown: Yes    Social History   Socioeconomic History   Marital status: Married    Spouse name: Not on file  Number of children: Not on file   Years of education: Not on file   Highest education level: Not on file  Occupational History   Occupation: home health care  Tobacco Use   Smoking status: Never   Smokeless tobacco: Never  Vaping Use   Vaping Use: Never used  Substance and Sexual Activity   Alcohol use: Never   Drug use: Never   Sexual activity: Not Currently    Birth control/protection: Surgical    Comment: tubal/hyst  Other Topics Concern   Not on file  Social History Narrative   Not on file   Social Determinants of Health    Financial Resource Strain: Low Risk  (12/01/2021)   Overall Financial Resource Strain (CARDIA)    Difficulty of Paying Living Expenses: Not hard at all  Food Insecurity: No Food Insecurity (12/01/2021)   Hunger Vital Sign    Worried About Running Out of Food in the Last Year: Never true    [place] in the Last Year: Never true  Transportation Needs: No Transportation Needs (12/01/2021)   PRAPARE - Hydrologist (Medical): No    Lack of Transportation (Non-Medical): No  Physical Activity: Sufficiently Active (12/01/2021)   Exercise Vital Sign    Days of Exercise per Week: 5 days    Minutes of Exercise per Session: 100 min  Stress: Stress Concern Present (12/01/2021)   [place]    Feeling of Stress : Very much  Social Connections: Moderately Isolated (12/01/2021)   Social Connection and Isolation Panel [NHANES]    Frequency of Communication with Friends and Family: More than three times a week    Frequency of Social Gatherings with Friends and Family: More than three times a week    Attends Religious Services: Never    Marine scientist or Organizations: No    Attends Music therapist: Never    Marital Status: Married    Current Medications:  Current Outpatient Medications:    acetaminophen (TYLENOL) 325 MG tablet, Take 2 tablets (650 mg total) by mouth every 6 (six) hours as needed for mild pain (temperature > 101.5.)., Disp: , Rfl:    ALPRAZolam (XANAX) 1 MG tablet, Take 1 mg by mouth 2 (two) times daily as needed., Disp: , Rfl:    clotrimazole-betamethasone (LOTRISONE) cream, Apply 1 Application topically 2 (two) times daily as needed., Disp: 30 g, Rfl: 2   ibuprofen (ADVIL) 600 MG tablet, Take 1 tablet (600 mg total) by mouth every 6 (six) hours., Disp: 30 tablet, Rfl: 0   losartan (COZAAR) 50 MG tablet, Take 50 mg by mouth daily., Disp: , Rfl:    metoprolol succinate  (TOPROL-XL) 50 MG 24 hr tablet, Take 50 mg by mouth daily., Disp: , Rfl:    omeprazole (PRILOSEC) 20 MG capsule, Take 20 mg by mouth daily., Disp: , Rfl:    simvastatin (ZOCOR) 40 MG tablet, Take 40 mg by mouth at bedtime., Disp: , Rfl:   Review of Systems: + Dyspareunia, pelvic pain, back pain, muscle pain/cramp Denies appetite changes, fevers, chills, fatigue, unexplained weight changes. Denies hearing loss, neck lumps or masses, mouth sores, ringing in ears or voice changes. Denies cough or wheezing.  Denies shortness of breath. Denies chest pain or palpitations. Denies leg swelling. Denies abdominal distention, pain, blood in stools, constipation, diarrhea, nausea, vomiting, or early satiety. Denies frequency, hematuria or incontinence. Denies vaginal bleeding or vaginal discharge.  Denies joint pain, back pain. Denies itching, rash, or wounds. Denies dizziness, headaches, numbness or seizures. Denies swollen lymph nodes or glands, denies easy bruising or bleeding. Denies anxiety, depression, confusion, or decreased concentration.  Physical Exam: BP (!) 156/89 (BP Location: Left Arm, Patient Position: Sitting) Comment: informed Dr Berline Lopes  Pulse 72   Temp 98.7 F (37.1 C) (Oral)   Resp 16   Wt 268 lb 14.4 oz (122 kg)   LMP 03/09/2022 (Exact Date) Comment: hcg negative 04/20/2022  SpO2 100%   BMI 50.81 kg/m  General: Alert, oriented, no acute distress. HEENT: Normocephalic, atraumatic, sclera anicteric. Chest: Unlabored breathing on room air. Abdomen: Obese, soft, nontender.  Normoactive bowel sounds.  No masses or hepatosplenomegaly appreciated.  Well-healed incision. Extremities: Grossly normal range of motion.  Warm, well perfused.  No edema bilaterally. Skin: No rashes or lesions noted. Lymphatics: No cervical, supraclavicular, or inguinal adenopathy. GU: Normal appearing external genitalia without erythema, excoriation, or lesions.  Speculum exam reveals well rugated  vaginal mucosa.  Cuff intact, no lesions noted.  Bimanual exam reveals cuff intact, no nodularity, exam somewhat limited by body habitus but I do not appreciate any pelvic masses.    Laboratory & Radiologic Studies: None new  Assessment & Plan: Michele Wu is a 42 y.o. 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   Patient is overall doing well but has developed some pelvic and back pain on the left side over the last few weeks.  I do not palpate any pelvic mass, but given some limitation of her exam secondary to body habitus and the history of her borderline tumor, I recommended that we get a pelvic ultrasound.  The patient is ready to move forward with completion surgery but would like to wait to do this after the new year.  We will pick a date in January and have her return in early January for a preoperative visit.  Given elevated CA125 in July, will repeat this today.  Patient endorses multiple symptoms consistent with menopause.  I suspect that there was some loss of ovarian function in her remaining left ovary after surgery.  We discussed possibility of starting hormone replacement therapy today versus waiting until her surgery.  Patient's preference is to avoid starting estrogen today but was interested in some samples of vaginal lubrication.   I am unsure what is continuing to cause her some pain when her bladder is full.  I can certainly perform cystoscopy at the time of her left oophorectomy and can evaluate the bladder intra-abdominal he.  22 minutes of total time was spent for this patient encounter, including preparation, face-to-face counseling with the patient and coordination of care, and documentation of the encounter.  Jeral Pinch, MD  Division of Gynecologic Oncology  Department of Obstetrics and Gynecology  [facility]

## 2022-09-09 LAB — CA 125: Cancer Antigen (CA) 125: 9.6 U/mL (ref 0.0–38.1)

## 2022-09-09 NOTE — Telephone Encounter (Signed)
Ms.Bartolo has chosen surgery date of 11/12/22. Pre-op, with Joylene John NP and follow up with Dr.Tucker has been scheduled for  11/06/22.  Joylene John NP, notified

## 2022-09-15 ENCOUNTER — Other Ambulatory Visit: Payer: Self-pay | Admitting: Gynecologic Oncology

## 2022-09-15 DIAGNOSIS — D4959 Neoplasm of unspecified behavior of other genitourinary organ: Secondary | ICD-10-CM

## 2022-09-15 DIAGNOSIS — D3911 Neoplasm of uncertain behavior of right ovary: Secondary | ICD-10-CM

## 2022-09-16 ENCOUNTER — Ambulatory Visit (HOSPITAL_COMMUNITY)
Admission: RE | Admit: 2022-09-16 | Discharge: 2022-09-16 | Disposition: A | Discharge status: Home / Self Care | Payer: 59 | Source: Ambulatory Visit | Attending: Gynecologic Oncology | Admitting: Gynecologic Oncology

## 2022-09-16 DIAGNOSIS — D3911 Neoplasm of uncertain behavior of right ovary: Secondary | ICD-10-CM | POA: Insufficient documentation

## 2022-09-24 ENCOUNTER — Telehealth: Payer: Self-pay

## 2022-09-24 DIAGNOSIS — R232 Flushing: Secondary | ICD-10-CM

## 2022-09-24 DIAGNOSIS — N898 Other specified noninflammatory disorders of vagina: Secondary | ICD-10-CM

## 2022-09-24 MED ORDER — ESTRADIOL 0.1 MG/24HR TD PTWK: 0.1000 mg | MEDICATED_PATCH | TRANSDERMAL | 12 refills | Status: DC

## 2022-09-24 NOTE — Telephone Encounter (Signed)
Pt called stating at her last visit with Dr.Tucker they discussed starting Estrogen. Pt states she was trying to wait until her surgery but she just can not wait any longer. She would like for Berline Lopes to send in the HRT to [facility] in [place].

## 2022-09-24 NOTE — Telephone Encounter (Signed)
Pt is aware of Climara patch being sent to her pharmacy

## 2022-11-04 DIAGNOSIS — R030 Elevated blood-pressure reading, without diagnosis of hypertension: Secondary | ICD-10-CM | POA: Diagnosis not present

## 2022-11-04 DIAGNOSIS — M545 Low back pain, unspecified: Secondary | ICD-10-CM | POA: Diagnosis not present

## 2022-11-04 DIAGNOSIS — Z6841 Body Mass Index (BMI) 40.0 and over, adult: Secondary | ICD-10-CM | POA: Diagnosis not present

## 2022-11-04 DIAGNOSIS — R300 Dysuria: Secondary | ICD-10-CM | POA: Diagnosis not present

## 2022-11-06 ENCOUNTER — Encounter: Payer: Self-pay | Admitting: Gynecologic Oncology

## 2022-11-06 ENCOUNTER — Inpatient Hospital Stay: Payer: 59 | Admitting: Gynecologic Oncology

## 2022-11-06 ENCOUNTER — Inpatient Hospital Stay: Payer: 59 | Attending: Gynecologic Oncology | Admitting: Gynecologic Oncology

## 2022-11-06 VITALS — BP 142/86 | HR 98 | Temp 98.0°F | Resp 14 | Wt 279.4 lb

## 2022-11-06 DIAGNOSIS — D3911 Neoplasm of uncertain behavior of right ovary: Secondary | ICD-10-CM | POA: Insufficient documentation

## 2022-11-06 DIAGNOSIS — I10 Essential (primary) hypertension: Secondary | ICD-10-CM | POA: Diagnosis not present

## 2022-11-06 DIAGNOSIS — Z7189 Other specified counseling: Secondary | ICD-10-CM | POA: Diagnosis not present

## 2022-11-06 NOTE — Patient Instructions (Incomplete)
Preparing for your Surgery  Plan for surgery on November 12, 2022 with Dr. Jeral Pinch at [facility] will be scheduled for robotic assisted laparoscopic left oophorectomy (removal of the left ovary), possible staging if a cancer or precancer is seen.   Pre-operative Testing -(Done, 1/15) You will receive a phone call from presurgical testing at [facility] to arrange for a pre-operative appointment and lab work.  -Bring your insurance card, copy of an advanced directive if applicable, medication list  -At that visit, you will be asked to sign a consent for a possible blood transfusion in case a transfusion becomes necessary during surgery.  The need for a blood transfusion is rare but having consent is a necessary part of your care.     -You should not be taking blood thinners or aspirin at least ten days prior to surgery unless instructed by your surgeon.  -Do not take supplements such as fish oil (omega 3), red yeast rice, turmeric before your surgery. You want to avoid medications with aspirin in them including headache powders such as BC or Goody's), Excedrin migraine.  Day Before Surgery at [place] will be asked to take in a light diet the day before surgery. You will be advised you can have clear liquids up until 3 hours before your surgery.    Eat a light diet the day before surgery.  Examples including soups, broths, toast, yogurt, mashed potatoes.  AVOID GAS PRODUCING FOODS. Things to avoid include carbonated beverages (fizzy beverages, sodas), raw fruits and raw vegetables (uncooked), or beans.   If your bowels are filled with gas, your surgeon will have difficulty visualizing your pelvic organs which increases your surgical risks.  Your role in recovery Your role is to become active as soon as directed by your doctor, while still giving yourself time to heal.  Rest when you feel tired. You will be asked to do the following in order to speed your  recovery:  - Cough and breathe deeply. This helps to clear and expand your lungs and can prevent pneumonia after surgery.  - [place]. Do mild physical activity. Walking or moving your legs help your circulation and body functions return to normal. Do not try to get up or walk alone the first time after surgery.   -If you develop swelling on one leg or the other, pain in the back of your leg, redness/warmth in one of your legs, please call the office or go to the Emergency Room to have a doppler to rule out a blood clot. For shortness of breath, chest pain-seek care in the Emergency Room as soon as possible. - Actively manage your pain. Managing your pain lets you move in comfort. We will ask you to rate your pain on a scale of zero to 10. It is your responsibility to tell your doctor or nurse where and how much you hurt so your pain can be treated.  Special Considerations -If you are diabetic, you may be placed on insulin after surgery to have closer control over your blood sugars to promote healing and recovery.  This does not mean that you will be discharged on insulin.  If applicable, your oral antidiabetics will be resumed when you are tolerating a solid diet.  -Your final pathology results from surgery should be available around one week after surgery and the results will be relayed to you when available.  -FMLA forms can be faxed to [phone] and please  allow 5-7 business days for completion.  Pain Management After Surgery -You have been prescribed your pain medication and bowel regimen medications before surgery so that you can have these available when you are discharged from the hospital. The pain medication is for use ONLY AFTER surgery and a new prescription will not be given.   -Make sure that you have Tylenol and Ibuprofen IF YOU ARE ABLE TO TAKE THESE MEDICATIONS at home to use on a regular basis after surgery for pain control. We recommend alternating the  medications every hour to six hours since they work differently and are processed in the body differently for pain relief.  -Review the attached handout on narcotic use and their risks and side effects.   Bowel Regimen -You have been prescribed Sennakot-S to take nightly to prevent constipation especially if you are taking the narcotic pain medication intermittently.  It is important to prevent constipation and drink adequate amounts of liquids. You can stop taking this medication when you are not taking pain medication and you are back on your normal bowel routine.  Risks of Surgery Risks of surgery are low but include bleeding, infection, damage to surrounding structures, re-operation, blood clots, and very rarely death.   Blood Transfusion Information (For the consent to be signed before surgery)  We will be checking your blood type before surgery so in case of emergencies, we will know what type of blood you would need.                                            WHAT IS A BLOOD TRANSFUSION?  A transfusion is the replacement of blood or some of its parts. Blood is made up of multiple cells which provide different functions. Red blood cells carry oxygen and are used for blood loss replacement. White blood cells fight against infection. Platelets control bleeding. Plasma helps clot blood. Other blood products are available for specialized needs, such as hemophilia or other clotting disorders. BEFORE THE TRANSFUSION  Who gives blood for transfusions?  You may be able to donate blood to be used at a later date on yourself (autologous donation). Relatives can be asked to donate blood. This is generally not any safer than if you have received blood from a stranger. The same precautions are taken to ensure safety when a relative's blood is donated. Healthy volunteers who are fully evaluated to make sure their blood is safe. This is blood bank blood. Transfusion therapy is the safest it has  ever been in the practice of medicine. Before blood is taken from a donor, a complete history is taken to make sure that person has no history of diseases nor engages in risky social behavior (examples are intravenous drug use or sexual activity with multiple partners). The donor's travel history is screened to minimize risk of transmitting infections, such as malaria. The donated blood is tested for signs of infectious diseases, such as HIV and hepatitis. The blood is then tested to be sure it is compatible with you in order to minimize the chance of a transfusion reaction. If you or a relative donates blood, this is often done in anticipation of surgery and is not appropriate for emergency situations. It takes many days to process the donated blood. RISKS AND COMPLICATIONS Although transfusion therapy is very safe and saves many lives, the main dangers of transfusion include:  Getting  an infectious disease. Developing a transfusion reaction. This is an allergic reaction to something in the blood you were given. Every precaution is taken to prevent this. The decision to have a blood transfusion has been considered carefully by your caregiver before blood is given. Blood is not given unless the benefits outweigh the risks.  AFTER SURGERY INSTRUCTIONS  Return to work: 4-6 weeks if applicable  Activity: 1. Be up and out of the bed during the day.  Take a nap if needed.  You may walk up steps but be careful and use the hand rail.  Stair climbing will tire you more than you think, you may need to stop part way and rest.   2. No lifting or straining for 6 weeks over 10 pounds. No pushing, pulling, straining for 6 weeks.  3. No driving for 1 week(s).  Do not drive if you are taking narcotic pain medicine and make sure that your reaction time has returned.   4. You can shower as soon as the next day after surgery. Shower daily.  Use your regular soap and water (not directly on the incision) and pat your  incision(s) dry afterwards; don't rub.  No tub baths or submerging your body in water until cleared by your surgeon. If you have the soap that was given to you by pre-surgical testing that was used before surgery, you do not need to use it afterwards because this can irritate your incisions.   5. No sexual activity and nothing in the vagina for 4 weeks.  6. You may experience a small amount of clear drainage from your incisions, which is normal.  If the drainage persists, increases, or changes color please call the office.  7. Do not use creams, lotions, or ointments such as neosporin on your incisions after surgery until advised by your surgeon because they can cause removal of the dermabond glue on your incisions.    8. You may experience vaginal spotting after surgery or around the 6-8 week mark from surgery when the stitches at the top of the vagina begin to dissolve.  The spotting is normal but if you experience heavy bleeding, call our office.  9. Take Tylenol or ibuprofen first for pain if you are able to take these medications and only use narcotic pain medication for severe pain not relieved by the Tylenol or Ibuprofen.  Monitor your Tylenol intake to a max of 4,000 mg in a 24 hour period. You can alternate these medications after surgery.  Diet: 1. Low sodium Heart Healthy Diet is recommended but you are cleared to resume your normal (before surgery) diet after your procedure.  2. It is safe to use a laxative, such as Miralax or Colace, if you have difficulty moving your bowels. You have been prescribed Sennakot-S to take at bedtime every evening after surgery to keep bowel movements regular and to prevent constipation.    Wound Care: 1. Keep clean and dry.  Shower daily.  Reasons to call the Doctor: Fever - Oral temperature greater than 100.4 degrees Fahrenheit Foul-smelling vaginal discharge Difficulty urinating Nausea and vomiting Increased pain at the site of the incision that  is unrelieved with pain medicine. Difficulty breathing with or without chest pain New calf pain especially if only on one side Sudden, continuing increased vaginal bleeding with or without clots.   Contacts: For questions or concerns you should contact:  Dr. Jeral Pinch at [phone]  Joylene John, NP at [phone]  After Hours: call [phone] and  have the GYN Oncologist paged/contacted (after 5 pm or on the weekends).  Messages sent via mychart are for non-urgent matters and are not responded to after hours so for urgent needs, please call the after hours number.

## 2022-11-06 NOTE — Patient Instructions (Signed)
We will tentatively plan for surgery at [facility] with Dr. Jeral Pinch on Feb 25, 2023. We will see you back in the office closer to the date for a preop appointment with Dr. Berline Lopes and Joylene John NP to discuss the instructions for before and after surgery.  You may also receive a phone call from the hospital to arrange for a pre-op appointment there as well. Usually both appointments can be combined on the same day.

## 2022-11-06 NOTE — Patient Instructions (Signed)
SURGICAL WAITING ROOM VISITATION  Patients having surgery or a procedure may have no more than 2 support people in the waiting area - these visitors may rotate.    Children under the age of 37 must have an adult with them who is not the patient.  Due to an increase in RSV and influenza rates and associated hospitalizations, children ages 22 and under may not visit patients in [place].  If the patient needs to stay at the hospital during part of their recovery, the visitor guidelines for inpatient rooms apply. Pre-op nurse will coordinate an appropriate time for 1 support person to accompany patient in pre-op.  This support person may not rotate.    Please refer to the [facility] website for the visitor guidelines for Inpatients (after your surgery is over and you are in a regular room).       Your procedure is scheduled on:  11/12/2022    Report to [facility] Main Entrance    Report to admitting at   0800AM   Call this number if you have problems the morning of surgery [phone]   Do not eat food :After Midnight.            Eat a light diet the day before surgery.  Avoid gas producing foods.     After Midnight you may have the following liquids until _ 0700_____ AM DAY OF SURGERY  Water Non-Citrus Juices (without pulp, NO RED-Apple, White grape, White cranberry) Black Coffee (NO MILK/CREAM OR CREAMERS, sugar ok)  Clear Tea (NO MILK/CREAM OR CREAMERS, sugar ok) regular and decaf                             Plain Jell-O (NO RED)                                           Fruit ices (not with fruit pulp, NO RED)                                     Popsicles (NO RED)                                                               Sports drinks like Gatorade (NO RED)                              If you have questions, please contact your surgeon's office.     Oral Hygiene is also important to reduce your risk of infection.                                     Remember - BRUSH YOUR TEETH THE MORNING OF SURGERY WITH YOUR REGULAR TOOTHPASTE  DENTURES WILL BE REMOVED PRIOR TO SURGERY PLEASE DO NOT APPLY "Poly grip" OR ADHESIVES!!!   Do NOT smoke after Midnight   Take these medicines the morning of surgery with  A SIP OF WATER:  toprol, omeprazole   DO NOT TAKE ANY ORAL DIABETIC MEDICATIONS DAY OF YOUR SURGERY  Bring CPAP mask and tubing day of surgery.                              You may not have any metal on your body including hair pins, jewelry, and body piercing             Do not wear make-up, lotions, powders, perfumes/cologne, or deodorant  Do not wear nail polish including gel and S&S, artificial/acrylic nails, or any other type of covering on natural nails including finger and toenails. If you have artificial nails, gel coating, etc. that needs to be removed by a nail salon please have this removed prior to surgery or surgery may need to be canceled/ delayed if the surgeon/ anesthesia feels like they are unable to be safely monitored.   Do not shave  48 hours prior to surgery.               Men may shave face and neck.   Do not bring valuables to the hospital. [place].   Contacts, glasses, dentures or bridgework may not be worn into surgery.   Bring small overnight bag day of surgery.   DO NOT [place]. PHARMACY WILL DISPENSE MEDICATIONS LISTED ON YOUR MEDICATION LIST TO YOU DURING YOUR ADMISSION [place]!    Patients discharged on the day of surgery will not be allowed to drive home.  Someone NEEDS to stay with you for the first 24 hours after anesthesia.   Special Instructions: Bring a copy of your healthcare power of attorney and living will documents the day of surgery if you haven't scanned them before.              Please read over the following fact sheets you were given: IF [place] [phone]   If you received a COVID test during your pre-op visit  it is requested that you wear a mask when out in public, stay away from anyone that may not be feeling well and notify your surgeon if you develop symptoms. If you test positive for Covid or have been in contact with anyone that has tested positive in the last 10 days please notify you surgeon.    [facility] - Preparing for Surgery Before surgery, you can play an important role.  Because skin is not sterile, your skin needs to be as free of germs as possible.  You can reduce the number of germs on your skin by washing with CHG (chlorahexidine gluconate) soap before surgery.  CHG is an antiseptic cleaner which kills germs and bonds with the skin to continue killing germs even after washing. Please DO NOT use if you have an allergy to CHG or antibacterial soaps.  If your skin becomes reddened/irritated stop using the CHG and inform your nurse when you arrive at Short Stay. Do not shave (including legs and underarms) for at least 48 hours prior to the first CHG shower.  You may shave your face/neck. Please follow these instructions carefully:  1.  Shower with CHG Soap the night before surgery and the  morning of Surgery.  2.  If you choose to  wash your hair, wash your hair first as usual with your  normal  shampoo.  3.  After you shampoo, rinse your hair and body thoroughly to remove the  shampoo.                           4.  Use CHG as you would any other liquid soap.  You can apply chg directly  to the skin and wash                       Gently with a scrungie or clean washcloth.  5.  Apply the CHG Soap to your body ONLY FROM THE NECK DOWN.   Do not use on face/ open                           Wound or open sores. Avoid contact with eyes, ears mouth and genitals (private parts).                       Wash face,  Genitals (private parts) with your normal soap.             6.  Wash thoroughly, paying special attention to  the area where your surgery  will be performed.  7.  Thoroughly rinse your body with warm water from the neck down.  8.  DO NOT shower/wash with your normal soap after using and rinsing off  the CHG Soap.                9.  Pat yourself dry with a clean towel.            10.  Wear clean pajamas.            11.  Place clean sheets on your bed the night of your first shower and do not  sleep with pets. Day of Surgery : Do not apply any lotions/deodorants the morning of surgery.  Please wear clean clothes to the hospital/surgery center.  FAILURE TO FOLLOW THESE INSTRUCTIONS MAY RESULT IN THE CANCELLATION OF YOUR SURGERY PATIENT SIGNATURE_________________________________  NURSE SIGNATURE__________________________________  ________________________________________________________________________

## 2022-11-06 NOTE — Progress Notes (Signed)
Gynecologic Oncology Return Clinic Visit  11/06/22  Reason for Visit: treatment planning  Treatment History: Patient developed irregular menses in December 2022 with abnormal uterine bleeding.  Would miss several months between menses, when she would bleeding, menses often lasted much longer and were heavier.   Pelvic ultrasound on 12/05/2021 revealed uterus measuring 7.9 x 3.3 x 5.3 cm.  Endometrial lining 4 mm.  Right ovary measures up to 3 cm and is normal in appearance.  Left ovary measures up to 3.4 cm.  Complicated cyst within the left ovary measures up to 3.3 cm containing multiple thin septations, no discrete mural nodules.  Complicated cystic lesion identified in the right adnexa, superior to the right ovary measuring up to 4.2 cm with an avascular, intracystic mural nodule measuring 14 x 6 x 11 mm.   Repeat pelvic ultrasound exam on 02/10/2022 reveals right ovary measures up to 2.6 cm and is normal in appearance.  There is a 4.1 x 3.1 x 3.8 cm complex right adnexal cyst with known vascular nodule measuring up to 1 cm.  Left ovary measures up to 3.1 cm with a septated complex left ovarian cyst measuring 2.3 x 1.8 x 1.1 cm.   In 02/2022, ROMA low risk. CA-125 was 30.7.   On 6/28, the patient underwent TAH/BS, right ovarian cystectomy and oophorectomy.  Findings at the time of surgery were a normal uterus and bilateral fallopian tubes. 4cm paratubal right ovarian cyst.  Normal left ovary.   CA-125 on 7/5 was 128.   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 Cytology was negative.    CT A/P on 06/12/2022 revealed no evidence of adenopathy or metastatic disease.  Postsurgical changes noted.  CA-125 on 09/08/22: 9.6.  Interval History: Patient reports continued intermittent pelvic and back symptoms.  These have come and gone since surgery.  They frequently happen when she urinates.  She describes this as low pelvic pain and back pain that will  last several minutes.  This started again a couple weeks ago.  She also describes some pressure with urination.  Notes that she was recently tested for urinary tract infection with her PCP and this was negative.  Is struggling with constipation right now.  Denies any vaginal bleeding.  Past Medical/Surgical History: Past Medical History:  Diagnosis Date   Anxiety    Chest pain    Complication of anesthesia    Depression    Dysuria    GERD (gastroesophageal reflux disease)    High cholesterol    Hypertension    Low back pain    PONV (postoperative nausea and vomiting)     Past Surgical History:  Procedure Laterality Date   ABDOMINAL HYSTERECTOMY N/A 04/22/2022   Procedure: HYSTERECTOMY ABDOMINAL;  Surgeon: Janyth Pupa, DO;  Location: [facility];  Service: Gynecology;  Laterality: N/A;   BILATERAL SALPINGECTOMY N/A 04/22/2022   Procedure: OPEN BILATERAL SALPINGECTOMY;  Surgeon: Janyth Pupa, DO;  Location: [facility];  Service: Gynecology;  Laterality: N/A;   CESAREAN SECTION     x2   CHOLECYSTECTOMY     OOPHORECTOMY Right 04/22/2022   Procedure: RIGHT OOPHORECTOMY AND RIGHT OVARIAN CYST REMOVAL;  Surgeon: Janyth Pupa, DO;  Location: [facility];  Service: Gynecology;  Laterality: Right;    Family History  Adopted: Yes  Family history unknown: Yes    Social History   Socioeconomic History   Marital status: Married    Spouse name: Not on file   Number of  children: Not on file   Years of education: Not on file   Highest education level: Not on file  Occupational History   Occupation: home health care  Tobacco Use   Smoking status: Never   Smokeless tobacco: Never  Vaping Use   Vaping Use: Never used  Substance and Sexual Activity   Alcohol use: Never   Drug use: Never   Sexual activity: Not Currently    Birth control/protection: Surgical    Comment: tubal/hyst  Other Topics Concern   Not on file  Social History Narrative   Not on file   Social Determinants of  Health   Financial Resource Strain: Low Risk  (12/01/2021)   Overall Financial Resource Strain (CARDIA)    Difficulty of Paying Living Expenses: Not hard at all  Food Insecurity: No Food Insecurity (12/01/2021)   Hunger Vital Sign    Worried About Running Out of Food in the Last Year: Never true    [place] in the Last Year: Never true  Transportation Needs: No Transportation Needs (12/01/2021)   PRAPARE - Hydrologist (Medical): No    Lack of Transportation (Non-Medical): No  Physical Activity: Sufficiently Active (12/01/2021)   Exercise Vital Sign    Days of Exercise per Week: 5 days    Minutes of Exercise per Session: 100 min  Stress: Stress Concern Present (12/01/2021)   [place]    Feeling of Stress : Very much  Social Connections: Moderately Isolated (12/01/2021)   Social Connection and Isolation Panel [NHANES]    Frequency of Communication with Friends and Family: More than three times a week    Frequency of Social Gatherings with Friends and Family: More than three times a week    Attends Religious Services: Never    Marine scientist or Organizations: No    Attends Archivist Meetings: Never    Marital Status: Married    Current Medications:  Current Outpatient Medications:    ALPRAZolam (XANAX) 1 MG tablet, Take 1 mg by mouth 2 (two) times daily as needed for anxiety., Disp: , Rfl:    clotrimazole-betamethasone (LOTRISONE) cream, Apply 1 Application topically 2 (two) times daily as needed., Disp: 30 g, Rfl: 2   estradiol (CLIMARA) 0.1 mg/24hr patch, Place 1 patch (0.1 mg total) onto the skin once a week., Disp: 4 patch, Rfl: 12   losartan (COZAAR) 100 MG tablet, Take 100 mg by mouth daily., Disp: , Rfl:    metoprolol succinate (TOPROL-XL) 50 MG 24 hr tablet, Take 50 mg by mouth daily., Disp: , Rfl:    omeprazole (PRILOSEC) 20 MG capsule, Take 20 mg by mouth daily.,  Disp: , Rfl:    simvastatin (ZOCOR) 40 MG tablet, Take 40 mg by mouth at bedtime., Disp: , Rfl:   Review of Systems: + Change in weight, constipation, painful urination, frequency, hot flashes, pelvic pain, vaginal discharge, back pain, muscle pain. Denies appetite changes, fevers, chills, fatigue. Denies hearing loss, neck lumps or masses, mouth sores, ringing in ears or voice changes. Denies cough or wheezing.  Denies shortness of breath. Denies chest pain or palpitations. Denies leg swelling. Denies abdominal distention, pain, blood in stools, diarrhea, nausea, vomiting, or early satiety. Denies hot flashes or vaginal bleeding.   Denies joint pain or back pain. Denies itching, rash, or wounds. Denies dizziness, headaches, numbness or seizures. Denies swollen lymph nodes or glands, denies easy bruising or bleeding.  Denies anxiety, depression, confusion, or decreased concentration.  Physical Exam: BP (!) 142/86 Comment: recheck from BP before.  Pulse 98   Temp 98 F (36.7 C) (Oral)   Resp 14   Wt 279 lb 6.4 oz (126.7 kg)   LMP 03/09/2022 (Exact Date) Comment: hcg negative 04/20/2022  SpO2 100%   BMI 52.79 kg/m  General: Alert, oriented, no acute distress. HEENT: Normocephalic, atraumatic, sclera anicteric. Chest: Unlabored breathing on room air.  Laboratory & Radiologic Studies: Component Ref Range & Units 1 mo ago 6 mo ago 8 mo ago  Cancer Antigen (CA) 125 0.0 - 38.1 U/mL 9.6 128.0 High  CM 30.7 CM   Pelvic ultrasound 11/22: FINDINGS: Uterus Surgically absent Right ovary Surgically absent. Left ovary Measurements: 3.9 x 2.2 x 2.6 cm = volume: 12 mL. A cyst in the ovary measures 2.1 x 1.6 x 2.1 cm. No imaging follow-up is recommended. Other findings No abnormal free fluid. IMPRESSION: 1. Status post hysterectomy and right salpingo-oophorectomy with no suspicious findings.  Assessment & Plan: DYMOND GUTT is a 42 y.o. woman with stage IA borderline tumor of  a para-tubal cyst.   Patient again reporting pelvic pain and symptoms related to urination.  She also is having constipation.  I suspect that her pelvic pain may actually be leading to constipation.  I encouraged her to start a regular bowel regimen such as using MiraLAX.  If she does not see improvement in her pelvic pain, I have asked her to call my office.  In this case, consented referral to urology or urogynecology.  The symptoms developed after her hysterectomy last summer.  We had planned for cystoscopy at the time of her surgery next week to assess the bladder for any possible cause of her symptoms.  The patient was scheduled for completion surgery next week.  She voices concern about financial stressors and not feeling ready for surgery.  She would like to move surgery.  I think that this is very reasonable.  We ultimately decided on early May and she will return in late April for preoperative visit.  Discussed recent ultrasound which was reassuring.  Repeat Ca1 25 in November was normal.  She continues to have some symptoms consistent with menopause.  We had previously discussed possibility of starting hormonal therapy but she would like to wait until her surgery.  Blood pressure was quite elevated in clinic today.  Patient endorsed having a headache that started recently.  This was rechecked prior to leaving clinic and significantly lower.  Given symptoms in the setting of her hypertension, I recommended further evaluation with her primary care provider, urgent care, or emergency department if symptoms persist.  22 minutes of total time was spent for this patient encounter, including preparation, face-to-face counseling with the patient and coordination of care, and documentation of the encounter.  Jeral Pinch, MD  Division of Gynecologic Oncology  Department of Obstetrics and Gynecology  [facility]

## 2022-11-06 NOTE — Progress Notes (Addendum)
Anesthesia Review:  PCP: Cardiologist : Chest x-ray : EKG : 04/20/22  Echo : 2020  Stress test: 2020  Cardiac Cath :  Activity level:  Sleep Study/ CPAP : Fasting Blood Sugar :      / Checks Blood Sugar -- times a day:   Blood Thinner/ Instructions /Last Dose: ASA / Instructions/ Last Dose :

## 2022-11-09 ENCOUNTER — Encounter (HOSPITAL_COMMUNITY): Payer: Self-pay

## 2022-11-09 ENCOUNTER — Encounter (HOSPITAL_COMMUNITY)
Admission: RE | Admit: 2022-11-09 | Discharge: 2022-11-09 | Disposition: A | Discharge status: Home / Self Care | Payer: 59 | Source: Ambulatory Visit | Attending: Family Medicine | Admitting: Family Medicine

## 2022-11-12 DIAGNOSIS — D4959 Neoplasm of unspecified behavior of other genitourinary organ: Secondary | ICD-10-CM

## 2022-11-17 DIAGNOSIS — Z6841 Body Mass Index (BMI) 40.0 and over, adult: Secondary | ICD-10-CM | POA: Diagnosis not present

## 2022-11-17 DIAGNOSIS — J029 Acute pharyngitis, unspecified: Secondary | ICD-10-CM | POA: Diagnosis not present

## 2022-11-17 DIAGNOSIS — R6889 Other general symptoms and signs: Secondary | ICD-10-CM | POA: Diagnosis not present

## 2022-11-17 DIAGNOSIS — R030 Elevated blood-pressure reading, without diagnosis of hypertension: Secondary | ICD-10-CM | POA: Diagnosis not present

## 2022-11-17 DIAGNOSIS — Z20828 Contact with and (suspected) exposure to other viral communicable diseases: Secondary | ICD-10-CM | POA: Diagnosis not present

## 2022-11-26 DIAGNOSIS — R69 Illness, unspecified: Secondary | ICD-10-CM | POA: Diagnosis not present

## 2022-11-26 DIAGNOSIS — K219 Gastro-esophageal reflux disease without esophagitis: Secondary | ICD-10-CM | POA: Diagnosis not present

## 2022-11-26 DIAGNOSIS — R739 Hyperglycemia, unspecified: Secondary | ICD-10-CM | POA: Diagnosis not present

## 2022-11-26 DIAGNOSIS — E785 Hyperlipidemia, unspecified: Secondary | ICD-10-CM | POA: Diagnosis not present

## 2022-11-26 DIAGNOSIS — I10 Essential (primary) hypertension: Secondary | ICD-10-CM | POA: Diagnosis not present

## 2022-11-26 DIAGNOSIS — Z6841 Body Mass Index (BMI) 40.0 and over, adult: Secondary | ICD-10-CM | POA: Diagnosis not present

## 2022-11-30 DIAGNOSIS — Z6841 Body Mass Index (BMI) 40.0 and over, adult: Secondary | ICD-10-CM | POA: Diagnosis not present

## 2022-11-30 DIAGNOSIS — R059 Cough, unspecified: Secondary | ICD-10-CM | POA: Diagnosis not present

## 2022-11-30 DIAGNOSIS — Z20828 Contact with and (suspected) exposure to other viral communicable diseases: Secondary | ICD-10-CM | POA: Diagnosis not present

## 2022-12-04 ENCOUNTER — Encounter: Payer: 59 | Admitting: Gynecologic Oncology

## 2022-12-05 ENCOUNTER — Other Ambulatory Visit (HOSPITAL_BASED_OUTPATIENT_CLINIC_OR_DEPARTMENT_OTHER): Payer: Self-pay

## 2022-12-05 DIAGNOSIS — R5383 Other fatigue: Secondary | ICD-10-CM

## 2022-12-05 DIAGNOSIS — R0683 Snoring: Secondary | ICD-10-CM

## 2022-12-14 ENCOUNTER — Ambulatory Visit: Payer: 59 | Admitting: Urology

## 2022-12-21 ENCOUNTER — Encounter: Payer: 59 | Admitting: Gynecologic Oncology

## 2023-01-25 ENCOUNTER — Ambulatory Visit: Payer: 59 | Admitting: Urology

## 2023-01-29 ENCOUNTER — Encounter: Payer: 59 | Admitting: Pulmonary Disease

## 2023-02-05 ENCOUNTER — Encounter: Payer: 59 | Admitting: Gynecologic Oncology

## 2023-02-09 ENCOUNTER — Encounter: Payer: Self-pay | Admitting: Gynecologic Oncology

## 2023-02-10 IMAGING — US US PELVIS COMPLETE WITH TRANSVAGINAL
2 series · 13 of 25 positions shown · non-contrast
Comparison: None

CLINICAL DATA: Menorrhagia with irregular cycle, dysmenorrhea,
heavy vaginal bleeding for 1 week, G3P2, LMP around October 2021,
history of Caesarean section



[Series 1: us pelvic complete with transvaginal · 12 of 102 slices shown (1 of 2)]
[im 1/102]
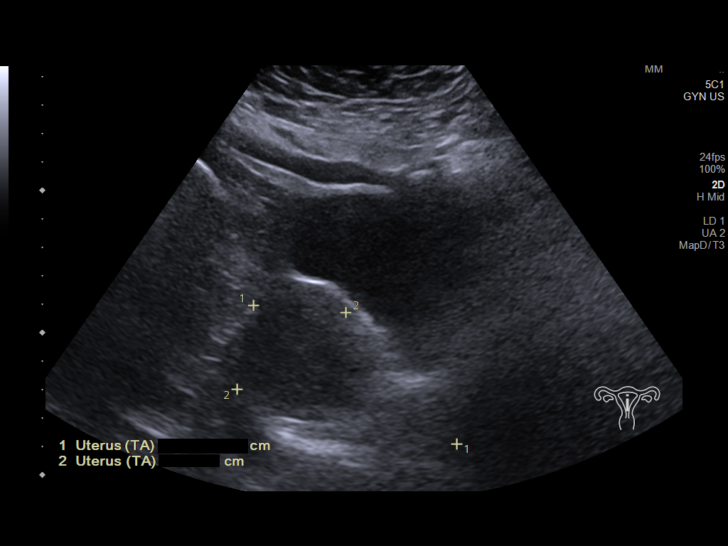
[im 9/102]
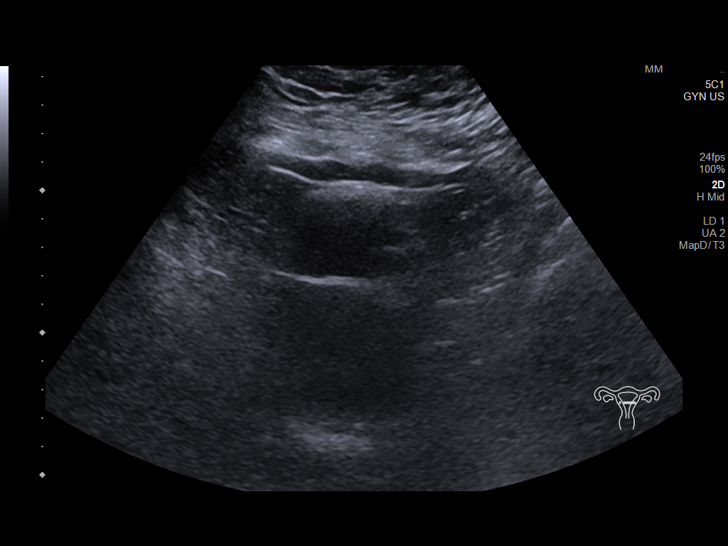
[im 18/102]
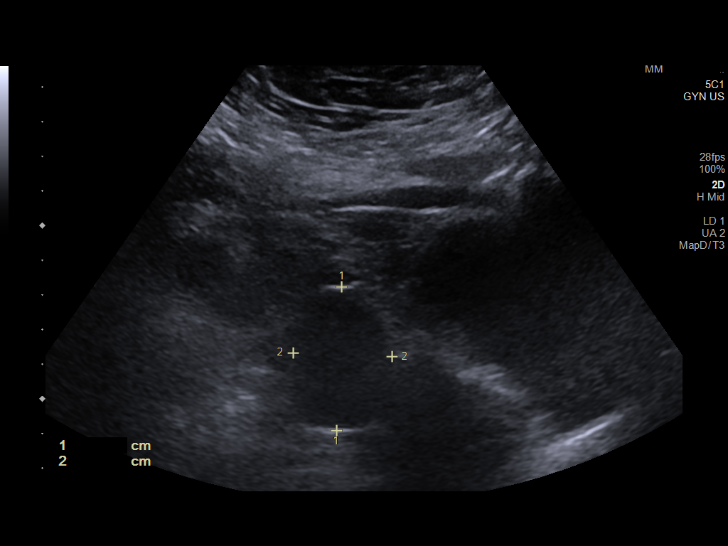
[im 27/102]
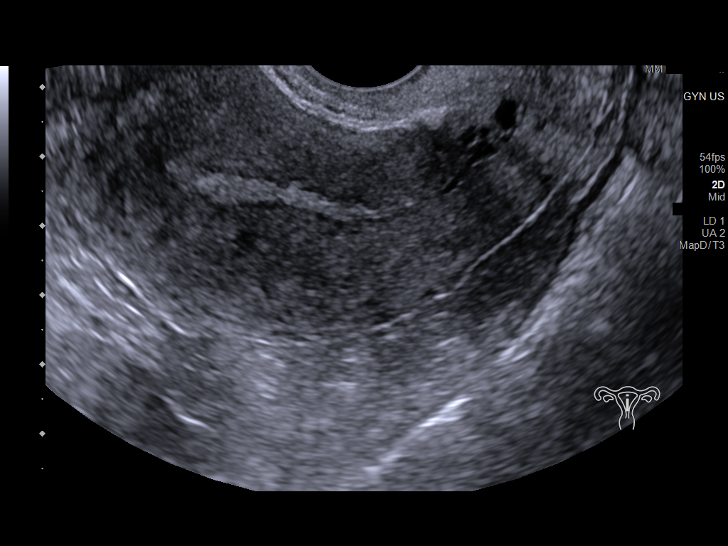
[im 36/102]
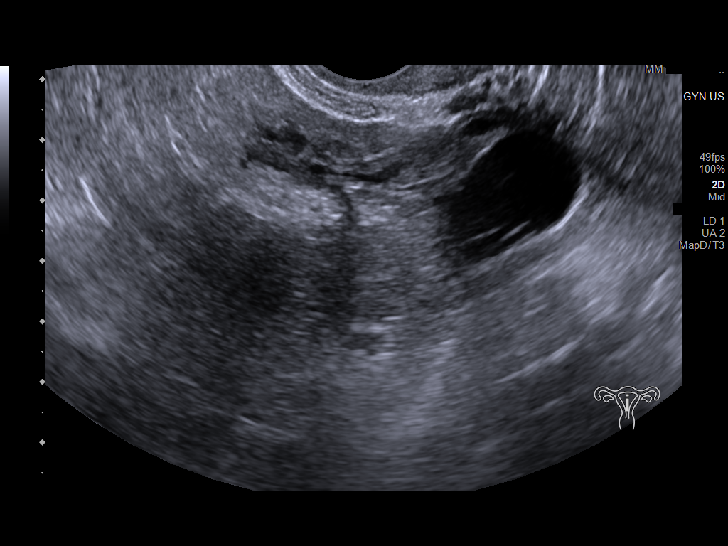
[im 44/102]
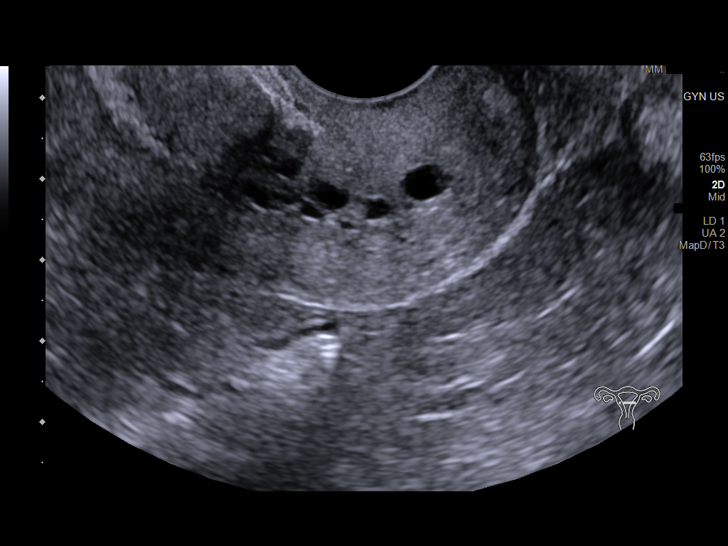
[im 53/102]
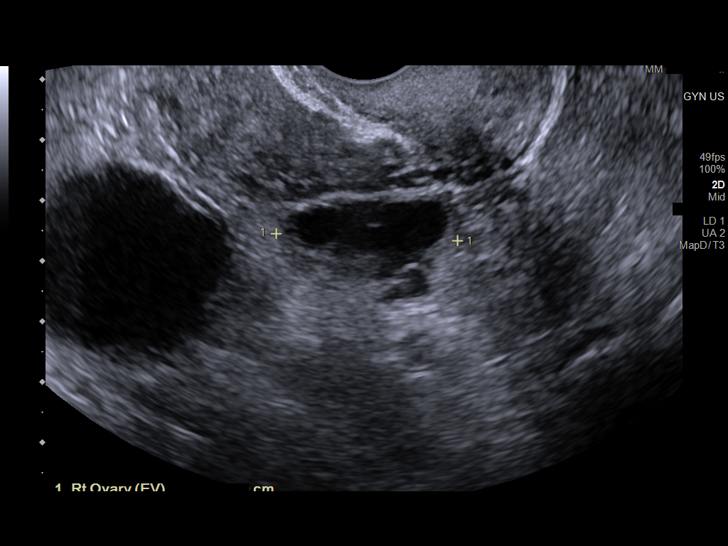
[im 62/102]
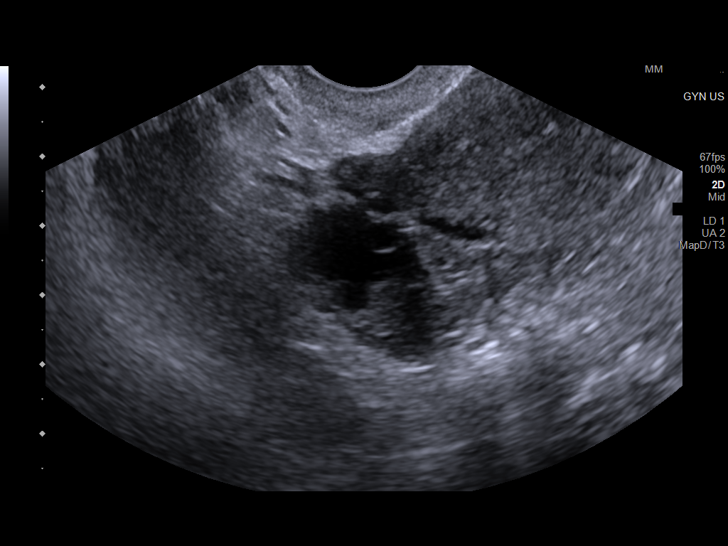
[im 71/102]
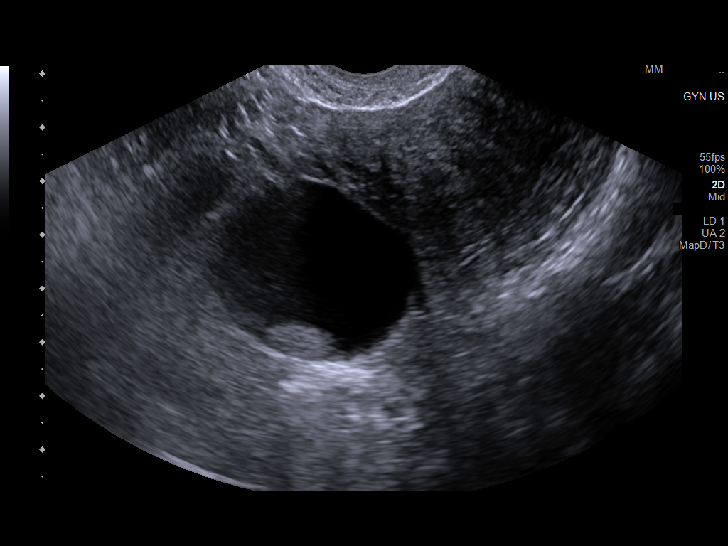
[im 80/102]
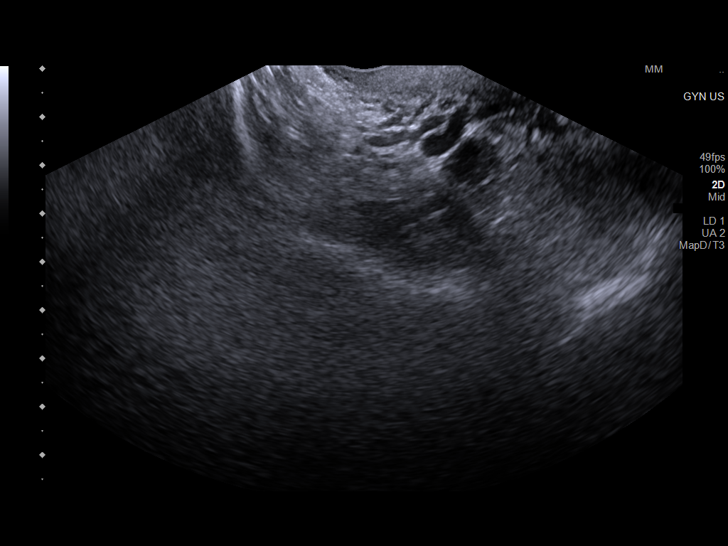
[im 88/102]
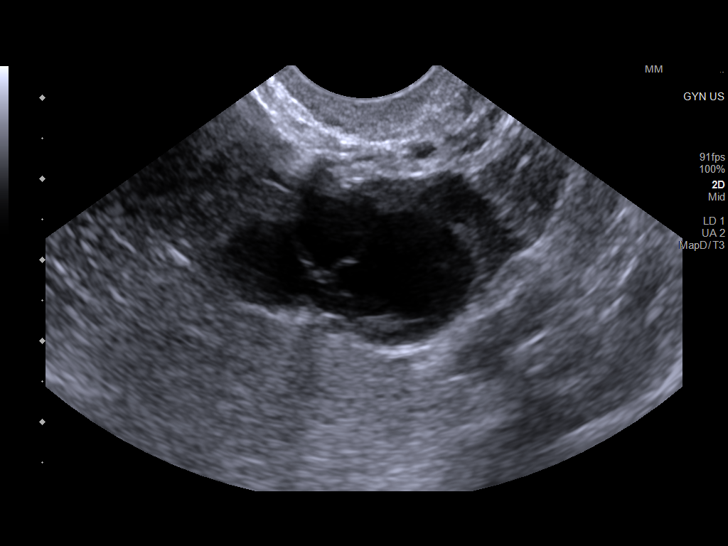
[im 97/102]
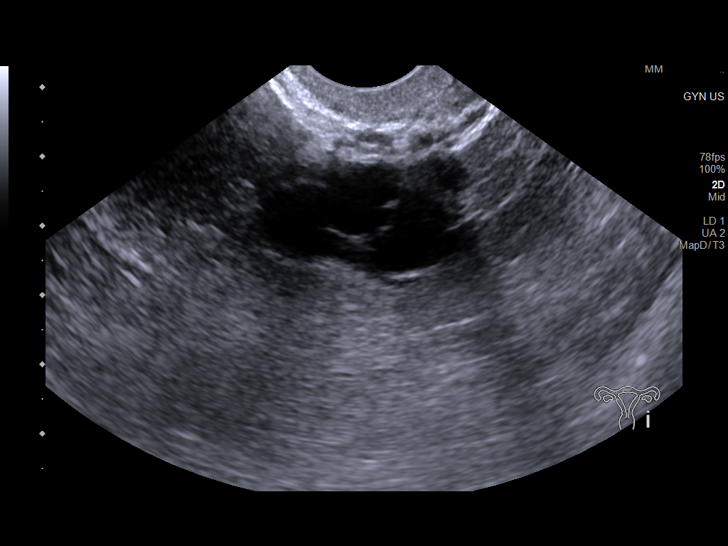

[Series 1: us pelvic complete with transvaginal · 1 of 2 slices shown (2 of 2)]
[im 1/2]
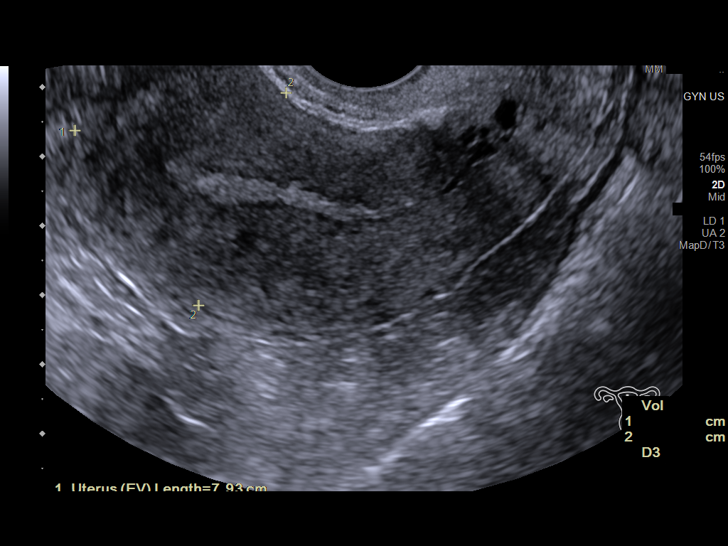

[13 of 25 positions shown; findings below may reference images not displayed]

FINDINGS: Uterus

Measurements: 7.9 x 3.3 x 5.3 cm = volume: 73 mL. Anteverted. Mildly
heterogeneous myometrium. No focal mass. Few nabothian cysts at
cervix.

Endometrium

Thickness: 4 mm. Trace endometrial fluid at upper uterine segment.
No focal mass.

Right ovary

Measurements: 3.0 x 1.8 x 3.0 cm = volume: 9.0 mL. Normal morphology
without intra-ovarian mass, see below

Left ovary

Measurements: 3.4 x 2.1 x 2.8 cm = volume: 10 mL. Complicated cyst
within LEFT ovary 3.3 x 1.7 x 2.7 cm containing multiple thin
septations. No discrete mural nodule.

Other findings

No free pelvic fluid. Complicated cystic lesion identified in RIGHT
adnexa, superior to RIGHT ovary, though in close proximity to the
RIGHT ovary, question ovarian versus paraovarian origin. Lesion
measures 4.2 x 3.3 x 3.8 cm and contains an avascular intracystic
mural nodule 14 x 6 x 11 mm.
IMPRESSION: Unremarkable uterus and endometrial complex.

Complicated cyst of the LEFT ovary 3.3 cm greatest size containing
multiple thin septations; surgical evaluation recommended.

In addition, complicated cyst in the RIGHT adnexa either of ovarian
or para-ovarian origin, 4.2 cm greatest size and containing an
avascular mural nodule; either MR imaging with and without contrast
or surgical evaluation recommended.

## 2023-02-11 NOTE — Progress Notes (Signed)
Gynecologic Oncology Return Clinic Visit  02/11/23  Reason for Visit: treatment planning   Treatment History: Patient developed irregular menses in December 2022 with abnormal uterine bleeding.  Would miss several months between menses, when she would bleeding, menses often lasted much longer and were heavier.   Pelvic ultrasound on 12/05/2021 revealed uterus measuring 7.9 x 3.3 x 5.3 cm.  Endometrial lining 4 mm.  Right ovary measures up to 3 cm and is normal in appearance.  Left ovary measures up to 3.4 cm.  Complicated cyst within the left ovary measures up to 3.3 cm containing multiple thin septations, no discrete mural nodules.  Complicated cystic lesion identified in the right adnexa, superior to the right ovary measuring up to 4.2 cm with an avascular, intracystic mural nodule measuring 14 x 6 x 11 mm.   Repeat pelvic ultrasound exam on 02/10/2022 reveals right ovary measures up to 2.6 cm and is normal in appearance.  There is a 4.1 x 3.1 x 3.8 cm complex right adnexal cyst with known vascular nodule measuring up to 1 cm.  Left ovary measures up to 3.1 cm with a septated complex left ovarian cyst measuring 2.3 x 1.8 x 1.1 cm.   In 02/2022, ROMA low risk. CA-125 was 30.7.   On 6/28, the patient underwent TAH/BS, right ovarian cystectomy and oophorectomy.  Findings at the time of surgery were a normal uterus and bilateral fallopian tubes. 4cm paratubal right ovarian cyst.  Normal left ovary.   CA-125 on 7/5 was 128.   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 Cytology was negative.    CT A/P on 06/12/2022 revealed no evidence of adenopathy or metastatic disease.  Postsurgical changes noted.   CA-125 on 09/08/22: 9.6.  Interval History: ***  Past Medical/Surgical History: Past Medical History:  Diagnosis Date   Anxiety    Chest pain    Complication of anesthesia    Depression    Dysuria    GERD (gastroesophageal reflux disease)     High cholesterol    Hypertension    Low back pain    PONV (postoperative nausea and vomiting)     Past Surgical History:  Procedure Laterality Date   ABDOMINAL HYSTERECTOMY N/A 04/22/2022   Procedure: HYSTERECTOMY ABDOMINAL;  Surgeon: Myna Hidalgo, DO;  Location: [facility];  Service: Gynecology;  Laterality: N/A;   BILATERAL SALPINGECTOMY N/A 04/22/2022   Procedure: OPEN BILATERAL SALPINGECTOMY;  Surgeon: Myna Hidalgo, DO;  Location: [facility];  Service: Gynecology;  Laterality: N/A;   CESAREAN SECTION     x2   CHOLECYSTECTOMY     OOPHORECTOMY Right 04/22/2022   Procedure: RIGHT OOPHORECTOMY AND RIGHT OVARIAN CYST REMOVAL;  Surgeon: Myna Hidalgo, DO;  Location: [facility];  Service: Gynecology;  Laterality: Right;    Family History  Adopted: Yes  Family history unknown: Yes    Social History   Socioeconomic History   Marital status: Married    Spouse name: Not on file   Number of children: Not on file   Years of education: Not on file   Highest education level: Not on file  Occupational History   Occupation: home health care  Tobacco Use   Smoking status: Never   Smokeless tobacco: Never  Vaping Use   Vaping Use: Never used  Substance and Sexual Activity   Alcohol use: Never   Drug use: Never   Sexual activity: Not Currently    Birth control/protection: Surgical    Comment:  tubal/hyst  Other Topics Concern   Not on file  Social History Narrative   Not on file   Social Determinants of Health   Financial Resource Strain: Low Risk  (12/01/2021)   Overall Financial Resource Strain (CARDIA)    Difficulty of Paying Living Expenses: Not hard at all  Food Insecurity: No Food Insecurity (12/01/2021)   Hunger Vital Sign    Worried About Running Out of Food in the Last Year: Never true    Ran Out of Food in the Last Year: Never true  Transportation Needs: No Transportation Needs (12/01/2021)   PRAPARE - Administrator, Civil Service (Medical): No    Lack of  Transportation (Non-Medical): No  Physical Activity: Sufficiently Active (12/01/2021)   Exercise Vital Sign    Days of Exercise per Week: 5 days    Minutes of Exercise per Session: 100 min  Stress: Stress Concern Present (12/01/2021)   [employer] of Occupational Health - Occupational Stress Questionnaire    Feeling of Stress : Very much  Social Connections: Moderately Isolated (12/01/2021)   Social Connection and Isolation Panel [NHANES]    Frequency of Communication with Friends and Family: More than three times a week    Frequency of Social Gatherings with Friends and Family: More than three times a week    Attends Religious Services: Never    Database administrator or Organizations: No    Attends Banker Meetings: Never    Marital Status: Married    Current Medications:  Current Outpatient Medications:    ALPRAZolam (XANAX) 1 MG tablet, Take 1 mg by mouth 2 (two) times daily as needed for anxiety., Disp: , Rfl:    estradiol (CLIMARA) 0.1 mg/24hr patch, Place 1 patch (0.1 mg total) onto the skin once a week., Disp: 4 patch, Rfl: 12   losartan (COZAAR) 100 MG tablet, Take 100 mg by mouth daily., Disp: , Rfl:    metoprolol succinate (TOPROL-XL) 50 MG 24 hr tablet, Take 50 mg by mouth daily., Disp: , Rfl:    omeprazole (PRILOSEC) 20 MG capsule, Take 20 mg by mouth daily., Disp: , Rfl:   Review of Systems: Denies appetite changes, fevers, chills, fatigue, unexplained weight changes. Denies hearing loss, neck lumps or masses, mouth sores, ringing in ears or voice changes. Denies cough or wheezing.  Denies shortness of breath. Denies chest pain or palpitations. Denies leg swelling. Denies abdominal distention, pain, blood in stools, constipation, diarrhea, nausea, vomiting, or early satiety. Denies pain with intercourse, dysuria, frequency, hematuria or incontinence. Denies hot flashes, pelvic pain, vaginal bleeding or vaginal discharge.   Denies joint pain, back pain or  muscle pain/cramps. Denies itching, rash, or wounds. Denies dizziness, headaches, numbness or seizures. Denies swollen lymph nodes or glands, denies easy bruising or bleeding. Denies anxiety, depression, confusion, or decreased concentration.  Physical Exam: LMP 03/09/2022 (Exact Date) Comment: hcg negative 04/20/2022 General: ***Alert, oriented, no acute distress. HEENT: ***Posterior oropharynx clear, sclera anicteric. Chest: ***Clear to auscultation bilaterally.  ***Port site clean. Cardiovascular: ***Regular rate and rhythm, no murmurs. Abdomen: ***Obese, soft, nontender.  Normoactive bowel sounds.  No masses or hepatosplenomegaly appreciated.  ***Well-healed scar. Extremities: ***Grossly normal range of motion.  Warm, well perfused.  No edema bilaterally. Skin: ***No rashes or lesions noted. Lymphatics: ***No cervical, supraclavicular, or inguinal adenopathy. GU: Normal appearing external genitalia without erythema, excoriation, or lesions.  Speculum exam reveals ***.  Bimanual exam reveals ***.  ***Rectovaginal exam  confirms ___.  Laboratory &  Radiologic Studies:      Component Ref Range & Units 5 mo ago 9 mo ago 11 mo ago  Cancer Antigen (CA) 125 0.0 - 38.1 U/mL 9.6 128.0 High  CM 30.7 CM     Assessment & Plan: Michele Wu is a 42 y.o. 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    Patient again reporting pelvic pain and symptoms related to urination.  She also is having constipation.  I suspect that her pelvic pain may actually be leading to constipation.  I encouraged her to start a regular bowel regimen such as using MiraLAX.  If she does not see improvement in her pelvic pain, I have asked her to call my office.  In this case, consented referral to urology or urogynecology.  The symptoms developed after her hysterectomy last summer.  We had planned for cystoscopy at the time of her surgery next week to assess the bladder for any possible cause of her  symptoms.   The patient was scheduled for completion surgery next week.  She voices concern about financial stressors and not feeling ready for surgery.  She would like to move surgery.  I think that this is very reasonable.  We ultimately decided on early May and she will return in late April for preoperative visit.   Discussed recent ultrasound which was reassuring.  Repeat Ca1 25 in November was normal.   She continues to have some symptoms consistent with menopause.  We had previously discussed possibility of starting hormonal therapy but she would like to wait until her surgery.  *** minutes of total time was spent for this patient encounter, including preparation, face-to-face counseling with the patient and coordination of care, and documentation of the encounter.  Eugene Garnet, MD  Division of Gynecologic Oncology  Department of Obstetrics and Gynecology  [facility]

## 2023-02-12 ENCOUNTER — Telehealth: Payer: Self-pay

## 2023-02-12 ENCOUNTER — Inpatient Hospital Stay: Payer: 59 | Admitting: Gynecologic Oncology

## 2023-02-12 DIAGNOSIS — D3911 Neoplasm of uncertain behavior of right ovary: Secondary | ICD-10-CM

## 2023-02-12 NOTE — Patient Instructions (Signed)
SURGICAL WAITING ROOM VISITATION  Patients having surgery or a procedure may have no more than 2 support people in the waiting area - these visitors may rotate.    Children under the age of 20 must have an adult with them who is not the patient.  Due to an increase in RSV and influenza rates and associated hospitalizations, children ages 37 and under may not visit patients in [facility] hospitals.  If the patient needs to stay at the hospital during part of their recovery, the visitor guidelines for inpatient rooms apply. Pre-op nurse will coordinate an appropriate time for 1 support person to accompany patient in pre-op.  This support person may not rotate.    Please refer to the [facility] website for the visitor guidelines for Inpatients (after your surgery is over and you are in a regular room).       Your procedure is scheduled on: 02-25-23   Report to [facility]    Report to admitting at      0515 AM   Call this number if you have problems the morning of surgery [phone]  Eat a light diet the day before surgery.  Examples including soups, broths, toast, yogurt, mashed potatoes.  Things to avoid include carbonated beverages (fizzy beverages), raw fruits and raw vegetables, or beans.   If your bowels are filled with gas, your surgeon will have difficulty visualizing your pelvic organs which increases your surgical risks.   Do not eat food :After Midnight.   After Midnight you may have the following liquids until _0430_____ AM DAY OF SURGERY   then nothuing by mouth  Water Non-Citrus Juices (without pulp, NO RED-Apple, White grape, White cranberry) Black Coffee (NO MILK/CREAM OR CREAMERS, sugar ok)  Clear Tea (NO MILK/CREAM OR CREAMERS, sugar ok) regular and decaf                             Plain Jell-O (NO RED)                                           Fruit ices (not with fruit pulp, NO RED)                                     Popsicles (NO RED)                                                                Sports drinks like Gatorade (NO RED)                            If you have questions, please contact your surgeon's office.   FOLLOW  ANY ADDITIONAL PRE OP INSTRUCTIONS YOU RECEIVED FROM YOUR SURGEON'S OFFICE!!!     Oral Hygiene is also important to reduce your risk of infection.  Remember - BRUSH YOUR TEETH THE MORNING OF SURGERY WITH YOUR REGULAR TOOTHPASTE  DENTURES WILL BE REMOVED PRIOR TO SURGERY PLEASE DO NOT APPLY "Poly grip" OR ADHESIVES!!!   Do NOT smoke after Midnight   Take these medicines the morning of surgery with A SIP OF WATER: omeprazole, metoprolol, xanax if needed  DO NOT TAKE ANY ORAL DIABETIC MEDICATIONS DAY OF YOUR SURGERY  Bring CPAP mask and tubing day of surgery.                              You may not have any metal on your body including hair pins, jewelry, and body piercing             Do not wear make-up, lotions, powders, perfumes/cologne, or deodorant  Do not wear nail polish including gel and S&S, artificial/acrylic nails, or any other type of covering on natural nails including finger and toenails. If you have artificial nails, gel coating, etc. that needs to be removed by a nail salon please have this removed prior to surgery or surgery may need to be canceled/ delayed if the surgeon/ anesthesia feels like they are unable to be safely monitored.   Do not shave  48 hours prior to surgery.            Do not bring valuables to the hospital. [facility] IS NOT             RESPONSIBLE   FOR VALUABLES.   Contacts, glasses, dentures or bridgework may not be worn into surgery.   Bring small overnight bag day of surgery.   DO NOT BRING YOUR HOME MEDICATIONS TO THE HOSPITAL. PHARMACY WILL DISPENSE MEDICATIONS LISTED ON YOUR MEDICATION LIST TO YOU DURING YOUR ADMISSION IN THE HOSPITAL!    Patients discharged on the day of surgery will not be allowed to  drive home.  Someone NEEDS to stay with you for the first 24 hours after anesthesia.   Special Instructions: Bring a copy of your healthcare power of attorney and living will documents the day of surgery if you haven't scanned them before.              Please read over the following fact sheets you were given: IF YOU HAVE QUESTIONS ABOUT YOUR PRE-OP INSTRUCTIONS PLEASE CALL [phone]   If you test positive for Covid or have been in contact with anyone that has tested positive in the last 10 days please notify you surgeon.     - Preparing for Surgery Before surgery, you can play an important role.  Because skin is not sterile, your skin needs to be as free of germs as possible.  You can reduce the number of germs on your skin by washing with CHG (chlorahexidine gluconate) soap before surgery.  CHG is an antiseptic cleaner which kills germs and bonds with the skin to continue killing germs even after washing. Please DO NOT use if you have an allergy to CHG or antibacterial soaps.  If your skin becomes reddened/irritated stop using the CHG and inform your nurse when you arrive at Short Stay. Do not shave (including legs and underarms) for at least 48 hours prior to the first CHG shower.  You may shave your face/neck. Please follow these instructions carefully:  1.  Shower with CHG Soap the night before surgery and the  morning of Surgery.  2.  If you choose to wash your hair, wash your  hair first as usual with your  normal  shampoo.  3.  After you shampoo, rinse your hair and body thoroughly to remove the  shampoo.                           4.  Use CHG as you would any other liquid soap.  You can apply chg directly  to the skin and wash                       Gently with a scrungie or clean washcloth.  5.  Apply the CHG Soap to your body ONLY FROM THE NECK DOWN.   Do not use on face/ open                           Wound or open sores. Avoid contact with eyes, ears mouth and genitals  (private parts).                       Wash face,  Genitals (private parts) with your normal soap.             6.  Wash thoroughly, paying special attention to the area where your surgery  will be performed.  7.  Thoroughly rinse your body with warm water from the neck down.  8.  DO NOT shower/wash with your normal soap after using and rinsing off  the CHG Soap.                9.  Pat yourself dry with a clean towel.            10.  Wear clean pajamas.            11.  Place clean sheets on your bed the night of your first shower and do not  sleep with pets. Day of Surgery : Do not apply any lotions/deodorants the morning of surgery.  Please wear clean clothes to the hospital/surgery center.  FAILURE TO FOLLOW THESE INSTRUCTIONS MAY RESULT IN THE CANCELLATION OF YOUR SURGERY PATIENT SIGNATURE_________________________________  NURSE SIGNATURE__________________________________  ________________________________________________________________________

## 2023-02-12 NOTE — Progress Notes (Signed)
PCP -  Cardiologist -   PPM/ICD -  Device Orders -  Rep Notified -   Chest x-ray -  EKG - 03-31-22 epic Stress Test - NM and stress echo 2018 epic ECHO -  Cardiac Cath -   Sleep Study -  CPAP -   Fasting Blood Sugar -  Checks Blood Sugar _____ times a day  Blood Thinner Instructions: Aspirin Instructions:  ERAS Protcol -Y PRE-SURGERY no drink   COVID vaccine -  Activity-- Anesthesia review:   Patient denies shortness of breath, fever, cough and chest pain at PAT appointment   All instructions explained to the patient, with a verbal understanding of the material. Patient agrees to go over the instructions while at home for a better understanding. Patient also instructed to self quarantine after being tested for COVID-19. The opportunity to ask questions was provided.

## 2023-02-12 NOTE — Telephone Encounter (Signed)
Michele Wu called today stating she will not be able to make her appointment with Dr. Pricilla Holm or Warner Mccreedy today.  I explained how important it is for her to keep this appointment since it is regarding her surgery on May 2nd. She states there is no way she can come today.   Appointment re-scheduled for 4/26 @ 8:00am with Dr. Pricilla Holm and 8:30 with Warner Mccreedy NP.  Pt agreed to date/times

## 2023-02-16 ENCOUNTER — Encounter (HOSPITAL_COMMUNITY): Payer: Self-pay

## 2023-02-16 ENCOUNTER — Encounter (HOSPITAL_COMMUNITY)
Admission: RE | Admit: 2023-02-16 | Discharge: 2023-02-16 | Disposition: A | Discharge status: Home / Self Care | Payer: 59 | Source: Ambulatory Visit | Attending: Anesthesiology | Admitting: Anesthesiology

## 2023-02-19 ENCOUNTER — Inpatient Hospital Stay: Payer: 59 | Admitting: Gynecologic Oncology

## 2023-02-19 ENCOUNTER — Inpatient Hospital Stay: Payer: 59 | Attending: Gynecologic Oncology | Admitting: Gynecologic Oncology

## 2023-02-19 ENCOUNTER — Telehealth: Payer: Self-pay | Admitting: *Deleted

## 2023-02-19 DIAGNOSIS — D3911 Neoplasm of uncertain behavior of right ovary: Secondary | ICD-10-CM

## 2023-02-19 NOTE — Telephone Encounter (Signed)
LMOM for the patient to call the office back regarding her missed appt from today and her surgery next week

## 2023-02-22 NOTE — Progress Notes (Signed)
Patient did not show to her appointment.

## 2023-02-25 ENCOUNTER — Encounter (HOSPITAL_COMMUNITY): Admission: RE | Payer: Self-pay | Source: Home / Self Care

## 2023-02-25 ENCOUNTER — Ambulatory Visit (HOSPITAL_COMMUNITY): Admission: RE | Admit: 2023-02-25 | Payer: Medicaid Other | Source: Home / Self Care | Admitting: Gynecologic Oncology

## 2023-02-25 DIAGNOSIS — D4959 Neoplasm of unspecified behavior of other genitourinary organ: Secondary | ICD-10-CM

## 2023-02-25 SURGERY — OOPHORECTOMY, ROBOT-ASSISTED
Anesthesia: General | Laterality: Left

## 2023-03-25 ENCOUNTER — Ambulatory Visit: Payer: 59 | Admitting: Gastroenterology

## 2023-04-01 ENCOUNTER — Ambulatory Visit: Payer: 59 | Admitting: Gastroenterology

## 2023-04-13 ENCOUNTER — Ambulatory Visit (INDEPENDENT_AMBULATORY_CARE_PROVIDER_SITE_OTHER): Payer: Medicaid Other | Admitting: Gastroenterology

## 2023-04-13 ENCOUNTER — Encounter: Payer: Self-pay | Admitting: Gastroenterology

## 2023-04-13 VITALS — BP 130/91 | HR 67 | Temp 97.3°F | Ht 60.5 in | Wt 274.5 lb

## 2023-04-13 DIAGNOSIS — K219 Gastro-esophageal reflux disease without esophagitis: Secondary | ICD-10-CM | POA: Diagnosis not present

## 2023-04-13 DIAGNOSIS — R1310 Dysphagia, unspecified: Secondary | ICD-10-CM

## 2023-04-13 MED ORDER — OMEPRAZOLE 40 MG PO CPDR: 40.0000 mg | DELAYED_RELEASE_CAPSULE | Freq: Two times a day (BID) | ORAL | 2 refills | Status: DC

## 2023-04-13 MED ORDER — SUCRALFATE 1 G PO TABS: 1.0000 g | ORAL_TABLET | Freq: Three times a day (TID) | ORAL | 0 refills | Status: DC

## 2023-04-13 NOTE — Patient Instructions (Addendum)
We are scheduling you for an upper endoscopy with possible dilation with Dr. Marletta Lor in the near future.  I want you to increase your omeprazole from once daily to twice daily.  You may take this 30 minutes prior to breakfast and dinner.  I have updated your prescription for you.  I am going to provide you with a 2-week course of Carafate.  You will take 1 tablet 4 times a day for the 2 weeks.  I want you to dissolve the tablet in 1 to 2 ounces of water and drink as a slurry to help coat your esophagus and your stomach.  Follow a GERD diet:  Avoid fried, fatty, greasy, spicy, citrus foods. Avoid caffeine and carbonated beverages. Avoid chocolate. Try eating 4-6 small meals a day rather than 3 large meals. Do not eat within 3 hours of laying down. Prop head of bed up on wood or bricks to create a 6 inch incline. Avoid NSAIDs   It was a pleasure to see you today. I want to create trusting relationships with patients. If you receive a survey regarding your visit,  I greatly appreciate you taking time to fill this out on paper or through your MyChart. I value your feedback.  Brooke Bonito, MSN, FNP-BC, AGACNP-BC [facility]

## 2023-04-13 NOTE — Progress Notes (Signed)
GI Office Note    Referring Provider: Richardean Chimera, MD Primary Care Physician:  Richardean Chimera, MD  Primary Gastroenterologist: Hennie Duos. Marletta Lor, DO  Chief Complaint   Chief Complaint  Patient presents with   Dysphagia    Patient here today due to issues with dysphagia and gerd. Patient says the reflux wakes her at night. She is taking Prilosec 40 mg once per day, which is not controlling her symptoms. She is having to vomit due to choking.    History of Present Illness   Michele Wu is a 42 y.o. female presenting today at the request of Richardean Chimera, MD for dysphagia and GERD.  Per PCP notes she has been maintained on omeprazole 40 mg once daily without much improvement of dysphagia.  Has been having solid food dysphagia for a while and states she has severe acid reflux. Has symptoms today and has not eaten anything. Has burning all the way up her throat even with medicine. Has had reflux for a long time and usually she has mild areas of improvement and at times has vomiting. When she gets the choking feeling she has to throw up and has been embarrassed in a restaurant before. No issues with specific meats. Not able to eat rice anymore as it makes her choked. Does have to clear throat a lot and she does have to cough often. Does a lot of burping now as well. Tacking her PPI at night usually. Has a great appetite.  Does admit to fairly regular use of ibuprofen a couple times per week for headaches.  No constipation or diarrhea.   States she had an upper endoscopy done in [place] and was told she had a mild hiatal hernia.   History of hysterectomy - June 2023. Still has left ovary. Take metoprolol and losartan for BP.    Current Outpatient Medications  Medication Sig Dispense Refill   ALPRAZolam (XANAX) 1 MG tablet Take 1 mg by mouth 2 (two) times daily as needed for anxiety.     losartan (COZAAR) 100 MG tablet Take 100 mg by mouth daily.     metoprolol succinate  (TOPROL-XL) 50 MG 24 hr tablet Take 50 mg by mouth daily.     omeprazole (PRILOSEC) 40 MG capsule Take 20 mg by mouth daily.     estradiol (CLIMARA) 0.1 mg/24hr patch Place 1 patch (0.1 mg total) onto the skin once a week. (Patient not taking: Reported on 04/13/2023) 4 patch 12   No current facility-administered medications for this visit.    Past Medical History:  Diagnosis Date   Anxiety    Chest pain    Complication of anesthesia    Depression    Dysuria    GERD (gastroesophageal reflux disease)    High cholesterol    Hypertension    Low back pain    PONV (postoperative nausea and vomiting)     Past Surgical History:  Procedure Laterality Date   ABDOMINAL HYSTERECTOMY N/A 04/22/2022   Procedure: HYSTERECTOMY ABDOMINAL;  Surgeon: Myna Hidalgo, DO;  Location: [facility];  Service: Gynecology;  Laterality: N/A;   BILATERAL SALPINGECTOMY N/A 04/22/2022   Procedure: OPEN BILATERAL SALPINGECTOMY;  Surgeon: Myna Hidalgo, DO;  Location: [facility];  Service: Gynecology;  Laterality: N/A;   CESAREAN SECTION     x2   CHOLECYSTECTOMY     OOPHORECTOMY Right 04/22/2022   Procedure: RIGHT OOPHORECTOMY AND RIGHT OVARIAN CYST REMOVAL;  Surgeon: Myna Hidalgo, DO;  Location: [facility];  Service: Gynecology;  Laterality: Right;    Family History  Adopted: Yes  Family history unknown: Yes    Allergies as of 04/13/2023 - Review Complete 04/13/2023  Allergen Reaction Noted   Bactrim [sulfamethoxazole-trimethoprim] Nausea And Vomiting 06/01/2022   Ciprofloxacin Nausea And Vomiting 12/01/2021   Cymbalta [duloxetine hcl] Nausea Only 08/18/2017   Augmentin [amoxicillin-pot clavulanate] Rash 08/18/2017   Macrobid [nitrofurantoin macrocrystal] Rash 08/18/2017    Social History   Socioeconomic History   Marital status: Married    Spouse name: Not on file   Number of children: Not on file   Years of education: Not on file   Highest education level: Not on file  Occupational History    Occupation: home health care  Tobacco Use   Smoking status: Never   Smokeless tobacco: Never  Vaping Use   Vaping Use: Never used  Substance and Sexual Activity   Alcohol use: Never   Drug use: Never   Sexual activity: Not Currently    Birth control/protection: Surgical    Comment: tubal/hyst  Other Topics Concern   Not on file  Social History Narrative   Not on file   Social Determinants of Health   Financial Resource Strain: Low Risk  (12/01/2021)   Overall Financial Resource Strain (CARDIA)    Difficulty of Paying Living Expenses: Not hard at all  Food Insecurity: No Food Insecurity (12/01/2021)   Hunger Vital Sign    Worried About Running Out of Food in the Last Year: Never true    Ran Out of Food in the Last Year: Never true  Transportation Needs: No Transportation Needs (12/01/2021)   PRAPARE - Administrator, Civil Service (Medical): No    Lack of Transportation (Non-Medical): No  Physical Activity: Sufficiently Active (12/01/2021)   Exercise Vital Sign    Days of Exercise per Week: 5 days    Minutes of Exercise per Session: 100 min  Stress: Stress Concern Present (12/01/2021)   [employer] of Occupational Health - Occupational Stress Questionnaire    Feeling of Stress : Very much  Social Connections: Moderately Isolated (12/01/2021)   Social Connection and Isolation Panel [NHANES]    Frequency of Communication with Friends and Family: More than three times a week    Frequency of Social Gatherings with Friends and Family: More than three times a week    Attends Religious Services: Never    Database administrator or Organizations: No    Attends Banker Meetings: Never    Marital Status: Married  Catering manager Violence: Not At Risk (12/01/2021)   Humiliation, Afraid, Rape, and Kick questionnaire    Fear of Current or Ex-Partner: No    Emotionally Abused: No    Physically Abused: No    Sexually Abused: No     Review of Systems   Gen:  Denies any fever, chills, fatigue, weight loss, lack of appetite.  CV: Denies chest pain, heart palpitations, peripheral edema, syncope.  Resp: Denies shortness of breath at rest or with exertion. Denies wheezing or cough.  GI: see HPI GU : Denies urinary burning, urinary frequency, urinary hesitancy MS: Denies joint pain, muscle weakness, cramps, or limitation of movement.  Derm: Denies rash, itching, dry skin Psych: Denies depression, anxiety, memory loss, and confusion Heme: Denies bruising, bleeding, and enlarged lymph nodes.  Physical Exam   BP (!) 130/91 (BP Location: Left Arm, Patient Position: Sitting, Cuff Size: Large)   Pulse 67   Temp Marland Kitchen)  97.3 F (36.3 C) (Temporal)   Ht 5' 0.5" (1.537 m)   Wt 274 lb 8 oz (124.5 kg)   LMP 03/09/2022 (Exact Date) Comment: hcg negative 04/20/2022  BMI 52.73 kg/m   General:   Alert and oriented. Pleasant and cooperative. Well-nourished and well-developed.  Head:  Normocephalic and atraumatic. Eyes:  Without icterus, sclera clear and conjunctiva pink.  Ears:  Normal auditory acuity. Mouth:  No deformity or lesions, oral mucosa pink.  Lungs:  Clear to auscultation bilaterally. No wheezes, rales, or rhonchi. No distress.  Heart:  S1, S2 present without murmurs appreciated.  Abdomen:  +BS, soft, non-distended.  TTP to epigastrium extending into the right upper quadrant.  No HSM noted. No guarding or rebound. No masses appreciated.  Rectal:  Deferred  Msk:  Symmetrical without gross deformities. Normal posture. Extremities:  Without edema. Neurologic:  Alert and  oriented x4;  grossly normal neurologically. Skin:  Intact without significant lesions or rashes. Psych:  Alert and cooperative. Normal mood and affect.   Assessment   AZRAEL THAKKER is a 42 y.o. female with a history of hyperglycemia, fatigue, ovarian cyst, GERD, HTN, HLD, anxiety/depression presenting today for evaluation of worsening reflux and dysphagia.   GERD: Not  well-controlled with omeprazole 40 mg once daily.  Does admit to regular NSAID use which could be contributing to worsening gastritis or peptic ulcer disease which is causing upper abdominal discomfort and worsening reflux symptoms.  Has a lot of burping as well as a lot of burning into her esophagus on a daily basis.  Denies any consumption of spicy foods or excessive amounts of caffeine.  Denies any alcohol or tobacco use.  History of upper endoscopy many years ago performed at [facility], report not available in Care Everywhere, patient is unsure of findings other than small hiatal hernia.  Will proceed with upper endoscopy for further evaluation of worsening reflux including esophagitis, gastritis, duodenitis, peptic ulcer disease, and to assess for H. pylori.  For now we will treat with 2-week course of Carafate and increase PPI to twice daily.  Dysphagia: Has been having chronic issues with solid food dysphagia for the last several months, possibly longer.  Dysphagia could be secondary to esophagitis and gastritis but will further evaluate with upper endoscopy and empiric dilation for possible esophageal ring, web, or other stenosis or stricture.  Will trial increasing PPI for worsening reflux in hopes that this will help with her dysphagia some as well.  PLAN   Proceed with upper endoscopy with dilation with propofol by Dr. Marletta Lor in near future: the risks, benefits, and alternatives have been discussed with the patient in detail. The patient states understanding and desires to proceed. ASA 3 (BMI) Omeprazole 40 mg BID Carafate 1g QID for 2 weeks.  Avoid NSAIDs, only use Tylenol. Follow up in 3 months   Brooke Bonito, MSN, FNP-BC, AGACNP-BC [facility]

## 2023-04-15 ENCOUNTER — Telehealth (INDEPENDENT_AMBULATORY_CARE_PROVIDER_SITE_OTHER): Payer: Self-pay | Admitting: Internal Medicine

## 2023-04-15 ENCOUNTER — Encounter (INDEPENDENT_AMBULATORY_CARE_PROVIDER_SITE_OTHER): Payer: Self-pay

## 2023-04-15 NOTE — Telephone Encounter (Signed)
PA approved via [facility]  Auth # [id number] DOS-04/14/23-07/12/23  Pre op scheduled for 04/26/23 at 2:45 pm. Will send pt a my chart message letting her know of appt.

## 2023-04-20 ENCOUNTER — Encounter: Payer: Self-pay | Admitting: Gastroenterology

## 2023-04-22 NOTE — Patient Instructions (Signed)
20    Your procedure is scheduled on: 05/03/2023  Report to [facility] Main Entrance at  11:45   AM.  Call this number if you have problems the morning of surgery: [phone]   Remember:   Follow instructions on letter from office regarding when to stop eating and drinking        No Smoking the day of procedure      Take these medicines the morning of surgery with A SIP OF WATER: metoprolol, omeprazole, and xanax   Do not wear jewelry, make-up or nail polish.  Do not wear lotions, powders, or perfumes. You may wear deodorant.                Do not bring valuables to the hospital.  Contacts, dentures or bridgework may not be worn into surgery.  Leave suitcase in the car. After surgery it may be brought to your room.  For patients admitted to the hospital, checkout time is 11:00 AM the day of discharge.   Patients discharged the day of surgery will not be allowed to drive home. Upper Endoscopy, Adult Upper endoscopy is a procedure to look inside the upper GI (gastrointestinal) tract. The upper GI tract is made up of: The part of the body that moves food from your mouth to your stomach (esophagus). The stomach. The first part of your small intestine (duodenum). This procedure is also called esophagogastroduodenoscopy (EGD) or gastroscopy. In this procedure, your health care provider passes a thin, flexible tube (endoscope) through your mouth and down your esophagus into your stomach. A small camera is attached to the end of the tube. Images from the camera appear on a monitor in the exam room. During this procedure, your health care provider may also remove a small piece of tissue to be sent to a lab and examined under a microscope (biopsy). Your health care provider may do an upper endoscopy to diagnose cancers of the upper GI tract. You may also have this procedure to find the cause of other conditions, such as: Stomach pain. Heartburn. Pain or problems when swallowing. Nausea and  vomiting. Stomach bleeding. Stomach ulcers. Tell a health care provider about: Any allergies you have. All medicines you are taking, including vitamins, herbs, eye drops, creams, and over-the-counter medicines. Any problems you or family members have had with anesthetic medicines. Any blood disorders you have. Any surgeries you have had. Any medical conditions you have. Whether you are pregnant or may be pregnant. What are the risks? Generally, this is a safe procedure. However, problems may occur, including: Infection. Bleeding. Allergic reactions to medicines. A tear or hole (perforation) in the esophagus, stomach, or duodenum. What happens before the procedure? Staying hydrated Follow instructions from your health care provider about hydration, which may include: Up to 4 hours before the procedure - you may continue to drink clear liquids, such as water, clear fruit juice, black coffee, and plain tea.   Medicines Ask your health care provider about: Changing or stopping your regular medicines. This is especially important if you are taking diabetes medicines or blood thinners. Taking medicines such as aspirin and ibuprofen. These medicines can thin your blood. Do not take these medicines unless your health care provider tells you to take them. Taking over-the-counter medicines, vitamins, herbs, and supplements. General instructions Plan to have someone take you home from the hospital or clinic. If you will be going home right after the procedure, plan to have someone with you for 24 hours. Ask  your health care provider what steps will be taken to help prevent infection. What happens during the procedure?  An IV will be inserted into one of your veins. You may be given one or more of the following: A medicine to help you relax (sedative). A medicine to numb the throat (local anesthetic). You will lie on your left side on an exam table. Your health care provider will pass the  endoscope through your mouth and down your esophagus. Your health care provider will use the scope to check the inside of your esophagus, stomach, and duodenum. Biopsies may be taken. The endoscope will be removed. The procedure may vary among health care providers and hospitals. What happens after the procedure? Your blood pressure, heart rate, breathing rate, and blood oxygen level will be monitored until you leave the hospital or clinic. Do not drive for 24 hours if you were given a sedative during your procedure. When your throat is no longer numb, you may be given some fluids to drink. It is up to you to get the results of your procedure. Ask your health care provider, or the department that is doing the procedure, when your results will be ready. Summary Upper endoscopy is a procedure to look inside the upper GI tract. During the procedure, an IV will be inserted into one of your veins. You may be given a medicine to help you relax. A medicine will be used to numb your throat. The endoscope will be passed through your mouth and down your esophagus. This information is not intended to replace advice given to you by your health care provider. Make sure you discuss any questions you have with your health care provider. Document Revised: 04/06/2018 Document Reviewed: 03/14/2018 Elsevier Patient Education  [employer]  Please read the instructions outlined below and refer to this sheet in the next few weeks. These discharge instructions provide you with general information on caring for yourself after you leave the hospital. Your doctor may also give you specific instructions. While your treatment has been planned according to the most current medical practices available, unavoidable complications occasionally occur. If you have any  problems or questions after discharge, please call your doctor. HOME CARE INSTRUCTIONS Activity You may resume your regular activity but move at a slower pace for the next 24 hours.  Take frequent rest periods for the next 24 hours.  Walking will help expel (get rid of) the air and reduce the bloated feeling in your abdomen.  No driving for 24 hours (because of the anesthesia (medicine) used during the test).  You may shower.  Do not sign any important legal documents or operate any machinery for 24 hours (because of the anesthesia  used during the test).  Nutrition Drink plenty of fluids.  You may resume your normal diet.  Begin with a light meal and progress to your normal diet.  Avoid alcoholic beverages for 24 hours or as instructed by your caregiver.  Medications You may resume your normal medications unless your caregiver tells you otherwise. What you can expect today You may experience abdominal discomfort such as a feeling of fullness or "gas" pains.  You may experience a sore throat for 2 to 3 days. This is normal. Gargling with salt water may help this.  Follow-up Your doctor will discuss the results of your test with you. SEEK IMMEDIATE MEDICAL CARE IF: You have excessive nausea (feeling sick to your stomach) and/or vomiting.  You have severe abdominal pain and distention (swelling).  You have trouble swallowing.  You have a temperature over 100 F (37.8 C).  You have rectal bleeding or vomiting of blood.  Document Released: 05/26/2004 Document Revised: 10/01/2011 Document Reviewed: 12/07/2007 Esophageal Dilatation Esophageal dilatation, also called esophageal dilation, is a procedure to widen or open a blocked or narrowed part of the esophagus. The esophagus is the part of the body that moves food and liquid from the mouth to the stomach. You may need this procedure if: You have a buildup of scar tissue in your esophagus that makes it difficult, painful, or impossible to  swallow. This can be caused by gastroesophageal reflux disease (GERD). You have cancer of the esophagus. There is a problem with how food moves through your esophagus. In some cases, you may need this procedure repeated at a later time to dilate the esophagus gradually. Tell a health care provider about: Any allergies you have. All medicines you are taking, including vitamins, herbs, eye drops, creams, and over-the-counter medicines. Any problems you or family members have had with anesthetic medicines. Any blood disorders you have. Any surgeries you have had. Any medical conditions you have. Any antibiotic medicines you are required to take before dental procedures. Whether you are pregnant or may be pregnant. What are the risks? Generally, this is a safe procedure. However, problems may occur, including: Bleeding due to a tear in the lining of the esophagus. A hole, or perforation, in the esophagus. What happens before the procedure? Ask your health care provider about: Changing or stopping your regular medicines. This is especially important if you are taking diabetes medicines or blood thinners. Taking medicines such as aspirin and ibuprofen. These medicines can thin your blood. Do not take these medicines unless your health care provider tells you to take them. Taking over-the-counter medicines, vitamins, herbs, and supplements. Follow instructions from your health care provider about eating or drinking restrictions. Plan to have a responsible adult take you home from the hospital or clinic. Plan to have a responsible adult care for you for the time you are told after you leave the hospital or clinic. This is important. What happens during the procedure? You may be given a medicine to help you relax (sedative). A numbing medicine may be sprayed into the back of your throat, or you may gargle the medicine. Your health care provider may perform the dilatation using various surgical  instruments, such as: Simple dilators. This instrument is carefully placed in the esophagus to stretch it. Guided wire bougies. This involves using an endoscope to insert a wire into the esophagus. A dilator is passed over this wire to enlarge the esophagus. Then the wire is removed. Balloon dilators. An endoscope with a small balloon  is inserted into the esophagus. The balloon is inflated to stretch the esophagus and open it up. The procedure may vary among health care providers and hospitals. What can I expect after the procedure? Your blood pressure, heart rate, breathing rate, and blood oxygen level will be monitored until you leave the hospital or clinic. Your throat may feel slightly sore and numb. This will get better over time. You will not be allowed to eat or drink until your throat is no longer numb. When you are able to drink, urinate, and sit on the edge of the bed without nausea or dizziness, you may be able to return home. Follow these instructions at home: Take over-the-counter and prescription medicines only as told by your health care provider. If you were given a sedative during the procedure, it can affect you for several hours. Do not drive or operate machinery until your health care provider says that it is safe. Plan to have a responsible adult care for you for the time you are told. This is important. Follow instructions from your health care provider about any eating or drinking restrictions. Do not use any products that contain nicotine or tobacco, such as cigarettes, e-cigarettes, and chewing tobacco. If you need help quitting, ask your health care provider. Keep all follow-up visits. This is important. Contact a health care provider if: You have a fever. You have pain that is not relieved by medicine. Get help right away if: You have chest pain. You have trouble breathing. You have trouble swallowing. You vomit blood. You have black, tarry, or bloody  stools. These symptoms may represent a serious problem that is an emergency. Do not wait to see if the symptoms will go away. Get medical help right away. Call your local emergency services (911 in the [place] Do not drive yourself to the hospital. Summary Esophageal dilatation, also called esophageal dilation, is a procedure to widen or open a blocked or narrowed part of the esophagus. Plan to have a responsible adult take you home from the hospital or clinic. For this procedure, a numbing medicine may be sprayed into the back of your throat, or you may gargle the medicine. Do not drive or operate machinery until your health care provider says that it is safe. This information is not intended to replace advice given to you by your health care provider. Make sure you discuss any questions you have with your health care provider. Document Revised: 02/28/2020 Document Reviewed: 02/28/2020 Elsevier Patient Education  2024 [employer].

## 2023-04-26 ENCOUNTER — Encounter (HOSPITAL_COMMUNITY)
Admission: RE | Admit: 2023-04-26 | Discharge: 2023-04-26 | Disposition: A | Discharge status: Home / Self Care | Payer: Medicaid Other | Source: Ambulatory Visit | Attending: Internal Medicine | Admitting: Internal Medicine

## 2023-04-26 ENCOUNTER — Encounter (HOSPITAL_COMMUNITY): Payer: Self-pay

## 2023-04-26 DIAGNOSIS — I10 Essential (primary) hypertension: Secondary | ICD-10-CM

## 2023-04-26 DIAGNOSIS — Z01818 Encounter for other preprocedural examination: Secondary | ICD-10-CM

## 2023-04-26 DIAGNOSIS — D649 Anemia, unspecified: Secondary | ICD-10-CM

## 2023-04-28 ENCOUNTER — Telehealth (INDEPENDENT_AMBULATORY_CARE_PROVIDER_SITE_OTHER): Payer: Self-pay | Admitting: Internal Medicine

## 2023-04-28 NOTE — Telephone Encounter (Signed)
Pt left voicemail needing to reschedule EGD on 05/03/23 with Dr.Carver. Left message to return call

## 2023-04-30 ENCOUNTER — Telehealth (INDEPENDENT_AMBULATORY_CARE_PROVIDER_SITE_OTHER): Payer: Self-pay | Admitting: Internal Medicine

## 2023-04-30 NOTE — Telephone Encounter (Signed)
FYI-Pt called into office and needed to reschedule EGD. Pt originally scheduled for 05/03/23 at 1:45pm with Dr.Carver (ASA 3). Pt wanted to reschedule for sometime later this month but schedule is full. Advised pt that schedule is full. Pt states that August would be fine. Will call pt in August to schedule.

## 2023-05-03 ENCOUNTER — Ambulatory Visit (HOSPITAL_COMMUNITY): Admission: RE | Admit: 2023-05-03 | Payer: Medicaid Other | Source: Home / Self Care

## 2023-05-03 ENCOUNTER — Encounter (HOSPITAL_COMMUNITY): Admission: RE | Payer: Self-pay | Source: Home / Self Care

## 2023-05-03 DIAGNOSIS — D649 Anemia, unspecified: Secondary | ICD-10-CM

## 2023-05-03 DIAGNOSIS — I10 Essential (primary) hypertension: Secondary | ICD-10-CM

## 2023-05-03 DIAGNOSIS — Z01818 Encounter for other preprocedural examination: Secondary | ICD-10-CM

## 2023-05-03 SURGERY — ESOPHAGOGASTRODUODENOSCOPY (EGD) WITH PROPOFOL
Anesthesia: Monitor Anesthesia Care

## 2023-06-21 ENCOUNTER — Encounter: Payer: 59 | Admitting: Pulmonary Disease

## 2023-07-13 ENCOUNTER — Ambulatory Visit (INDEPENDENT_AMBULATORY_CARE_PROVIDER_SITE_OTHER): Payer: Medicaid Other | Admitting: Gastroenterology

## 2023-07-13 ENCOUNTER — Encounter: Payer: Self-pay | Admitting: Gastroenterology

## 2023-07-13 VITALS — BP 138/83 | HR 65 | Temp 97.6°F | Ht 61.0 in | Wt 283.8 lb

## 2023-07-13 DIAGNOSIS — R5383 Other fatigue: Secondary | ICD-10-CM

## 2023-07-13 DIAGNOSIS — R1310 Dysphagia, unspecified: Secondary | ICD-10-CM | POA: Diagnosis not present

## 2023-07-13 DIAGNOSIS — K219 Gastro-esophageal reflux disease without esophagitis: Secondary | ICD-10-CM | POA: Diagnosis not present

## 2023-07-13 DIAGNOSIS — R739 Hyperglycemia, unspecified: Secondary | ICD-10-CM | POA: Diagnosis not present

## 2023-07-13 MED ORDER — ESOMEPRAZOLE MAGNESIUM 40 MG PO CPDR: 40.0000 mg | DELAYED_RELEASE_CAPSULE | Freq: Two times a day (BID) | ORAL | 2 refills | Status: DC

## 2023-07-13 MED ORDER — SUCRALFATE 1 G PO TABS: 1.0000 g | ORAL_TABLET | Freq: Three times a day (TID) | ORAL | 0 refills | Status: DC

## 2023-07-13 NOTE — Patient Instructions (Addendum)
Please have blood work completed at [facility].  We will call you with results once they have been received. Please allow 3-5 business days for review. 2 locations for [facility] in [place]:              1. [address], [place]              2. [address] [place],    Please stop taking omeprazole and start taking Nexium 40 mg twice daily.  I have also sent in Carafate again for you to try to help with some of the burning you have been having.  As we discussed in the office today I would recommend you trying to dissolve the tablet in 1-2 ounces of water and let it dissolve into a slurry that you can drink to help coat your throat and stomach.  We will plan to get you rescheduled for upper endoscopy with dilation with Dr. Marletta Lor in the near future.  I would recommend you start a fiber supplement such as Benefiber whether that be a powder you mix in 8 ounces of liquid, the tablets, or Gummies if you would like.  This would be to help you with some constipation given some lower abdominal cramping you have at times.  I will see you in about 3 months, this will be after your upper endoscopy.  It was a pleasure to see you today. I want to create trusting relationships with patients. If you receive a survey regarding your visit,  I greatly appreciate you taking time to fill this out on paper or through your MyChart. I value your feedback.  Brooke Bonito, MSN, FNP-BC, AGACNP-BC [facility]

## 2023-07-13 NOTE — Progress Notes (Unsigned)
GI Office Note    Referring Provider: Richardean Chimera, MD Primary Care Physician:  Richardean Chimera, MD Primary Gastroenterologist: Hennie Duos. Marletta Lor, DO  Date:  07/13/2023  ID:  Michele Wu, DOB 1981-09-30, MRN [id number]   Chief Complaint   Chief Complaint  Patient presents with   Follow-up    Follow up. Still having acid reflux and hurting in her back   History of Present Illness  Michele Wu is a 42 y.o. female with a history of fatigue, hyperglycemia, ovarian cyst, GERD, HLD, HTN, anxiety/depression presenting today for follow-up with ongoing acid reflux.  Last office visit for initial consultation 04/13/2023.  Reported solid food dysphagia for a while and severe acid reflux.  Having symptoms today even without eating anything.  Burning all the way up her throat even with medication.  Has had mild improvement at times, sometimes having vomiting.  Feels a choking sensation like she needs to throw up and has occasionally happened in a restaurant.  Taking PPI nightly.  Good appetite.  Admitted to fairly frequent use of NSAIDs.  Reported history of a mild hiatal hernia before on EGD performed in [place]. Scheduled for EGD with dilation with Dr. Marletta Lor.  Advised omeprazole 40 mg twice daily and Carafate 4 times daily for 2 weeks.  Advised to avoid NSAIDs.  Follow-up in 3 months.  EGD was initially scheduled for 05/03/2023 however patient had to reschedule and given all the schedule was full she was advised that she will be contacted to reschedule however she has not been contacted.  Today: Will have heartburn even without eating. Even water causes burning. Continues to experience dysphagia.   Having some lower abdominal pain with bowel movements. Denies constipation however goes about every other day for the most part. Stools are not hard. Most of the time she does not strain but today she was. Lower back has been hurting some as well. Denies any N/V. No increases or lack of  appetite.   No melena or brbpr. She is concerned about her weight gain. Goes to her PCP in November. She does report some tiredness recently as well.   Goes back in October to see how she is doing post hysterectomy.    Current Outpatient Medications  Medication Sig Dispense Refill   ALPRAZolam (XANAX) 1 MG tablet Take 1 mg by mouth 2 (two) times daily as needed for anxiety.     doxycycline (VIBRA-TABS) 100 MG tablet Take 100 mg by mouth 2 (two) times daily.     losartan (COZAAR) 100 MG tablet Take 100 mg by mouth daily.     metoprolol succinate (TOPROL-XL) 50 MG 24 hr tablet Take 50 mg by mouth daily.     omeprazole (PRILOSEC) 40 MG capsule Take 1 capsule (40 mg total) by mouth in the morning and at bedtime. 60 capsule 2   simvastatin (ZOCOR) 40 MG tablet Take 40 mg by mouth at bedtime.     valACYclovir (VALTREX) 1000 MG tablet Take 1,000 mg by mouth 2 (two) times daily.     sucralfate (CARAFATE) 1 g tablet Take 1 tablet (1 g total) by mouth 4 (four) times daily -  with meals and at bedtime for 14 days. Let 1 tablet dissolve in 1 to 2 ounces of water and drink as a slurry. (Patient not taking: Reported on 07/13/2023) 56 tablet 0   No current facility-administered medications for this visit.    Past Medical History:  Diagnosis Date   Anxiety  Chest pain    Complication of anesthesia    Depression    Dysuria    GERD (gastroesophageal reflux disease)    High cholesterol    Hypertension    Low back pain    PONV (postoperative nausea and vomiting)     Past Surgical History:  Procedure Laterality Date   ABDOMINAL HYSTERECTOMY N/A 04/22/2022   Procedure: HYSTERECTOMY ABDOMINAL;  Surgeon: Myna Hidalgo, DO;  Location: [facility];  Service: Gynecology;  Laterality: N/A;   BILATERAL SALPINGECTOMY N/A 04/22/2022   Procedure: OPEN BILATERAL SALPINGECTOMY;  Surgeon: Myna Hidalgo, DO;  Location: [facility];  Service: Gynecology;  Laterality: N/A;   CESAREAN SECTION     x2   CHOLECYSTECTOMY      OOPHORECTOMY Right 04/22/2022   Procedure: RIGHT OOPHORECTOMY AND RIGHT OVARIAN CYST REMOVAL;  Surgeon: Myna Hidalgo, DO;  Location: [facility];  Service: Gynecology;  Laterality: Right;    Family History  Adopted: Yes  Family history unknown: Yes    Allergies as of 07/13/2023 - Review Complete 07/13/2023  Allergen Reaction Noted   Bactrim [sulfamethoxazole-trimethoprim] Nausea And Vomiting 06/01/2022   Ciprofloxacin Nausea And Vomiting 12/01/2021   Cymbalta [duloxetine hcl] Nausea Only 08/18/2017   Augmentin [amoxicillin-pot clavulanate] Rash 08/18/2017   Macrobid [nitrofurantoin macrocrystal] Rash 08/18/2017    Social History   Socioeconomic History   Marital status: Married    Spouse name: Not on file   Number of children: Not on file   Years of education: Not on file   Highest education level: Not on file  Occupational History   Occupation: home health care  Tobacco Use   Smoking status: Never   Smokeless tobacco: Never  Vaping Use   Vaping status: Never Used  Substance and Sexual Activity   Alcohol use: Never   Drug use: Never   Sexual activity: Not Currently    Birth control/protection: Surgical    Comment: tubal/hyst  Other Topics Concern   Not on file  Social History Narrative   Not on file   Social Determinants of Health   Financial Resource Strain: Low Risk  (12/01/2021)   Overall Financial Resource Strain (CARDIA)    Difficulty of Paying Living Expenses: Not hard at all  Food Insecurity: No Food Insecurity (12/01/2021)   Hunger Vital Sign    Worried About Running Out of Food in the Last Year: Never true    Ran Out of Food in the Last Year: Never true  Transportation Needs: No Transportation Needs (12/01/2021)   PRAPARE - Administrator, Civil Service (Medical): No    Lack of Transportation (Non-Medical): No  Physical Activity: Sufficiently Active (12/01/2021)   Exercise Vital Sign    Days of Exercise per Week: 5 days    Minutes of Exercise  per Session: 100 min  Stress: Stress Concern Present (12/01/2021)   [employer] of Occupational Health - Occupational Stress Questionnaire    Feeling of Stress : Very much  Social Connections: Moderately Isolated (12/01/2021)   Social Connection and Isolation Panel [NHANES]    Frequency of Communication with Friends and Family: More than three times a week    Frequency of Social Gatherings with Friends and Family: More than three times a week    Attends Religious Services: Never    Database administrator or Organizations: No    Attends Banker Meetings: Never    Marital Status: Married     Review of Systems   Gen: Denies fever, chills, anorexia.  Denies fatigue, weakness, weight loss.  CV: Denies chest pain, palpitations, syncope, peripheral edema, and claudication. Resp: Denies dyspnea at rest, cough, wheezing, coughing up blood, and pleurisy. GI: See HPI Derm: Denies rash, itching, dry skin Psych: Denies depression, anxiety, memory loss, confusion. No homicidal or suicidal ideation.  Heme: Denies bruising, bleeding, and enlarged lymph nodes.   Physical Exam   BP 138/83 (BP Location: Right Arm, Patient Position: Sitting, Cuff Size: Large)   Pulse 65   Temp 97.6 F (36.4 C) (Temporal)   Ht 5\' 1"  (1.549 m)   Wt 283 lb 12.8 oz (128.7 kg)   LMP 03/09/2022 (Exact Date) Comment: hcg negative 04/20/2022  BMI 53.62 kg/m   General:   Alert and oriented. No distress noted. Pleasant and cooperative.  Head:  Normocephalic and atraumatic. Eyes:  Conjuctiva clear without scleral icterus. Mouth:  Oral mucosa pink and moist. Good dentition. No lesions. Lungs:  Clear to auscultation bilaterally. No wheezes, rales, or rhonchi. No distress.  Heart:  S1, S2 present without murmurs appreciated.  Abdomen:  +BS, soft, non-tender and non-distended. No rebound or guarding. No HSM or masses noted. Rectal: *** Msk:  Symmetrical without gross deformities. Normal posture. Extremities:   Without edema. Neurologic:  Alert and  oriented x4 Psych:  Alert and cooperative. Normal mood and affect.   Assessment  Michele Wu is a 42 y.o. female with a history of fatigue, hyperglycemia, ovarian cyst, GERD, HLD, HTN, anxiety/depression presenting today for follow-up with ongoing acid reflux.  GERD:   Dysphagia:   Constipation: Lower abdominal cramping with BMs. Sometimes goes every other day, something regularly. Occasional has to strain. Will check TSH.   Fatigue, weight gain: Check anemia panel, A1c, CBC, CMP, TSH. ***  PLAN   CBC, CMP, TSH, A1c, Anemia panel Proceed with upper endoscopy with dilation with propofol by Dr. Marletta Lor in near future: the risks, benefits, and alternatives have been discussed with the patient in detail. The patient states understanding and desires to proceed. ASA 3 (BMI) Stop omeprazole 40 mg twice daily and start nexium 40 mg BID.  Resume course of carafate for 3 weeks.  Start benefiber 2-3 teaspoons daily.  Continue to avoid NSAIDs Plan for colonoscopy next year after March 2025 (Age 83) Follow up in 3 months.     Brooke Bonito, MSN, FNP-BC, AGACNP-BC [facility]

## 2023-07-14 ENCOUNTER — Encounter: Payer: Self-pay | Admitting: *Deleted

## 2023-07-14 ENCOUNTER — Telehealth: Payer: Self-pay | Admitting: *Deleted

## 2023-07-14 MED ORDER — PANTOPRAZOLE SODIUM 40 MG PO TBEC: 40.0000 mg | DELAYED_RELEASE_TABLET | Freq: Two times a day (BID) | ORAL | 2 refills | Status: DC

## 2023-07-14 NOTE — Telephone Encounter (Signed)
[facility]  EGD/ED w/Dr.Carver, asa 3

## 2023-07-14 NOTE — Telephone Encounter (Signed)
Pt has been scheduled for 08/23/23. Instructions mailed.   [employer] PA: Status: PENDED  Reason: 1.Disposition pending review Tracking #: [id number]

## 2023-07-15 ENCOUNTER — Encounter: Payer: Self-pay | Admitting: *Deleted

## 2023-07-15 NOTE — Telephone Encounter (Signed)
[employer] PA: Approval # [id number] DOS: 08/23/2023-11/21/2023

## 2023-07-22 ENCOUNTER — Encounter: Payer: Self-pay | Admitting: *Deleted

## 2023-07-29 ENCOUNTER — Encounter: Payer: Self-pay | Admitting: Adult Health

## 2023-07-29 ENCOUNTER — Ambulatory Visit: Payer: Medicaid Other | Admitting: Adult Health

## 2023-07-29 VITALS — BP 157/110 | HR 73 | Ht 60.0 in | Wt 278.0 lb

## 2023-07-29 DIAGNOSIS — R4589 Other symptoms and signs involving emotional state: Secondary | ICD-10-CM

## 2023-07-29 DIAGNOSIS — Z90710 Acquired absence of both cervix and uterus: Secondary | ICD-10-CM | POA: Insufficient documentation

## 2023-07-29 DIAGNOSIS — D3911 Neoplasm of uncertain behavior of right ovary: Secondary | ICD-10-CM | POA: Insufficient documentation

## 2023-07-29 DIAGNOSIS — F419 Anxiety disorder, unspecified: Secondary | ICD-10-CM

## 2023-07-29 DIAGNOSIS — N898 Other specified noninflammatory disorders of vagina: Secondary | ICD-10-CM | POA: Insufficient documentation

## 2023-07-29 DIAGNOSIS — Z1211 Encounter for screening for malignant neoplasm of colon: Secondary | ICD-10-CM | POA: Diagnosis not present

## 2023-07-29 DIAGNOSIS — R350 Frequency of micturition: Secondary | ICD-10-CM | POA: Diagnosis not present

## 2023-07-29 DIAGNOSIS — Z01419 Encounter for gynecological examination (general) (routine) without abnormal findings: Secondary | ICD-10-CM | POA: Diagnosis not present

## 2023-07-29 DIAGNOSIS — I10 Essential (primary) hypertension: Secondary | ICD-10-CM | POA: Insufficient documentation

## 2023-07-29 DIAGNOSIS — F32A Depression, unspecified: Secondary | ICD-10-CM | POA: Insufficient documentation

## 2023-07-29 DIAGNOSIS — N83202 Unspecified ovarian cyst, left side: Secondary | ICD-10-CM

## 2023-07-29 LAB — POCT URINALYSIS DIPSTICK OB
Blood, UA: NEGATIVE
Glucose, UA: NEGATIVE
Ketones, UA: NEGATIVE
Leukocytes, UA: NEGATIVE
Nitrite, UA: NEGATIVE
POC,PROTEIN,UA: NEGATIVE

## 2023-07-29 LAB — HEMOCCULT GUIAC POC 1CARD (OFFICE): Fecal Occult Blood, POC: NEGATIVE

## 2023-07-29 MED ORDER — ESTRADIOL 0.1 MG/GM VA CREA: TOPICAL_CREAM | VAGINAL | 1 refills | Status: DC

## 2023-07-29 MED ORDER — ESCITALOPRAM OXALATE 10 MG PO TABS: 10.0000 mg | ORAL_TABLET | Freq: Every day | ORAL | 2 refills | Status: DC

## 2023-07-29 NOTE — Progress Notes (Signed)
Patient ID: Michele Wu, female   DOB: 1981/05/12, 42 y.o.   MRN: [id number] History of Present Illness: Michele Wu is a 42 year old white female, married, sp [facility] 6.28.23 has borderline ovarian malignancy. She has cyst left ovary, with low risk ROMA in 2023, CA125 was 9.6 09/08/22. She is complaining of being moody and not her self since hysterectomy, has vaginal dryness and some urinary  frequency and back hurts at times. She is also tired.  PCP is Dr Reuel Boom   Current Medications, Allergies, Past Medical History, Past Surgical History, Family History and Social History were reviewed in [employer] electronic medical record.     Review of Systems: Patient denies any headaches, hearing loss, blurred vision, shortness of breath, chest pain, abdominal pain, problems with bowel movements,  or intercourse(vagina is dry and she has some pain and loss of interest). No joint pain or mood swings.  See HPI for positives.   Physical Exam:BP (!) 157/110 (BP Location: Left Arm, Patient Position: Sitting, Cuff Size: Normal) Comment (BP Location): forearm  Pulse 73   Ht 5' (1.524 m)   Wt 278 lb (126.1 kg)   LMP 03/09/2022 (Exact Date) Comment: hcg negative 04/20/2022  BMI 54.29 kg/m  urine dipstick was negative  she has not taken BP meds in 2 days  General:  Well developed, well nourished, no acute distress Skin:  Warm and dry Neck:  Midline trachea, normal thyroid, good ROM, no lymphadenopathy Lungs; Clear to auscultation bilaterally Breast:  No dominant palpable mass, retraction, or nipple discharge Cardiovascular: Regular rate and rhythm Abdomen:  Soft, non tender, no hepatosplenomegaly,obese Pelvic:  External genitalia is normal in appearance, no lesions.  The vagina is normal in appearance. Urethra has no lesions or masses. The cervix and uterus is absent.  No adnexal masses, LLQ tenderness noted.Bladder is non tender, no masses felt. Rectal: Good sphincter tone, no polyps, or hemorrhoids  felt.  Hemoccult negative. Extremities/musculoskeletal:  No swelling or varicosities noted, no clubbing or cyanosis Psych:  No mood changes, alert and cooperative,seems happy AA is 0 Fall risk is low    07/29/2023    8:40 AM 12/01/2021    9:53 AM  Depression screen PHQ 2/9  Decreased Interest 0 0  Down, Depressed, Hopeless 3 0  PHQ - 2 Score 3 0  Altered sleeping 3   Tired, decreased energy 3   Change in appetite 0   Feeling bad or failure about yourself  3   Trouble concentrating 0   Moving slowly or fidgety/restless 0   Suicidal thoughts 0   PHQ-9 Score 12        07/29/2023    8:41 AM 12/01/2021    9:54 AM  GAD 7 : Generalized Anxiety Score  Nervous, Anxious, on Edge 3 3  Control/stop worrying 3 3  Worry too much - different things 3 3  Trouble relaxing 3 3  Restless 0 3  Easily annoyed or irritable 3 3  Afraid - awful might happen 0 3  Total GAD 7 Score 15 21    Upstream - 07/29/23 0839       Pregnancy Intention Screening   Does the patient want to become pregnant in the next year? No    Would the patient like to discuss contraceptive options today? No      Contraception Wrap Up   Current Method Female Sterilization   hysterectomy   End Method Female Sterilization    Contraception Counseling Provided No  Examination chaperoned by Freddie Apley RN  Impression and Plan: 1. Urinary frequency Urine was negative  - POC Urinalysis Dipstick OB  2. Encounter for well woman exam with routine gynecological exam Physical in 1 year  Mammogram was negative 2023 Colonoscopy at 43(she is adopted)  3. Encounter for screening fecal occult blood testing Hemoccult was negative  - POCT occult blood stool  4. Moody +moody, feels anger Will rx lexapro 10 mg 1 daily   5. S/P abdominal hysterectomy and right salpingo-oophorectomy - US PELVIC COMPLETE WITH TRANSVAGINAL; Future  6. Cyst of left ovary Hx left ovary cyst  Has LLQ tenderness today +will get  follow up US 08/10/23 at [facility] at 8:30 am to assess left ovary - US PELVIC COMPLETE WITH TRANSVAGINAL; Future Return 08/18/23 to see Dr Charlotta Newton and review [name] and options  7. Vaginal dryness +vaginal dryness Will try estrace vaginal cream, and after about 2-3 weeks can try sex with good lubricate,like astroglide, coconut oil or olive oil Meds ordered this encounter  Medications   estradiol (ESTRACE VAGINAL) 0.1 MG/GM vaginal cream    Sig: Use 0.5 gm nightly for 2 weeks in vagina then 2-3 x weekly    Dispense:  42.5 g    Refill:  1    Order Specific Question:   Supervising Provider    Answer:   Despina Hidden, LUTHER H [[id number]]   escitalopram (LEXAPRO) 10 MG tablet    Sig: Take 1 tablet (10 mg total) by mouth daily.    Dispense:  30 tablet    Refill:  2    Order Specific Question:   Supervising Provider    Answer:   Despina Hidden, LUTHER H [[id number]]     8. Ovarian tumor of borderline malignancy, right   9. Hypertension, unspecified type Go home and take BP meds and take daily Follow up with PCP  10. Anxiety and depression Will rx lexapro 10 mg 1 daily

## 2023-07-30 ENCOUNTER — Telehealth: Payer: Self-pay | Admitting: *Deleted

## 2023-07-30 ENCOUNTER — Other Ambulatory Visit: Payer: Self-pay | Admitting: Adult Health

## 2023-07-30 MED ORDER — PREMARIN 0.625 MG/GM VA CREA: TOPICAL_CREAM | VAGINAL | 12 refills | Status: DC

## 2023-07-30 NOTE — Telephone Encounter (Signed)
Received a notification from pharmacy that Estradiol needs PA. Spoke with pharmacy who stated Premarin was preferred by Medicaid this month. Will send in per [facility] with same directions for use.

## 2023-08-10 ENCOUNTER — Ambulatory Visit (HOSPITAL_COMMUNITY)
Admission: RE | Admit: 2023-08-10 | Discharge: 2023-08-10 | Disposition: A | Discharge status: Home / Self Care | Payer: Medicaid Other | Source: Ambulatory Visit | Attending: Adult Health | Admitting: Adult Health

## 2023-08-10 DIAGNOSIS — Z90721 Acquired absence of ovaries, unilateral: Secondary | ICD-10-CM | POA: Insufficient documentation

## 2023-08-10 DIAGNOSIS — Z90710 Acquired absence of both cervix and uterus: Secondary | ICD-10-CM | POA: Diagnosis present

## 2023-08-10 DIAGNOSIS — N83202 Unspecified ovarian cyst, left side: Secondary | ICD-10-CM | POA: Diagnosis present

## 2023-08-10 DIAGNOSIS — Z9079 Acquired absence of other genital organ(s): Secondary | ICD-10-CM | POA: Diagnosis present

## 2023-08-18 ENCOUNTER — Ambulatory Visit: Payer: Medicaid Other | Admitting: Obstetrics & Gynecology

## 2023-08-18 ENCOUNTER — Encounter: Payer: Self-pay | Admitting: Obstetrics & Gynecology

## 2023-08-18 ENCOUNTER — Telehealth: Payer: Self-pay | Admitting: *Deleted

## 2023-08-18 VITALS — BP 166/100 | HR 63 | Ht 60.0 in | Wt 282.2 lb

## 2023-08-18 DIAGNOSIS — N951 Menopausal and female climacteric states: Secondary | ICD-10-CM | POA: Diagnosis not present

## 2023-08-18 DIAGNOSIS — F419 Anxiety disorder, unspecified: Secondary | ICD-10-CM | POA: Diagnosis not present

## 2023-08-18 DIAGNOSIS — D3911 Neoplasm of uncertain behavior of right ovary: Secondary | ICD-10-CM

## 2023-08-18 DIAGNOSIS — F32A Depression, unspecified: Secondary | ICD-10-CM | POA: Diagnosis not present

## 2023-08-18 MED ORDER — ESCITALOPRAM OXALATE 20 MG PO TABS
20.0000 mg | ORAL_TABLET | Freq: Every day | ORAL | 4 refills | Status: DC
Start: 2023-08-18 — End: 2023-11-10

## 2023-08-18 NOTE — Progress Notes (Signed)
   GYN VISIT Patient name: Michele Wu MRN [id number]  Date of birth: 1981/08/20 Chief Complaint:   Follow-up ("I need to discuss emotional issues.")  History of Present Illness:   Michele Wu is a 42 y.o. 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  She feels like she is "going crazy."  She notes mood swings/hot flashes and notes that it is causing trouble in her marriage.  Rates her symptoms 9/10.  Taking Lexapro and feels like it is not helping     Patient's last menstrual period was 03/09/2022 (exact date).    Review of Systems:   Pertinent items are noted in HPI Denies fever/chills, dizziness, headaches, visual disturbances, fatigue, shortness of breath, chest pain, abdominal pain, vomiting. Pertinent History Reviewed:   Past Surgical History:  Procedure Laterality Date   ABDOMINAL HYSTERECTOMY N/A 04/22/2022   Procedure: HYSTERECTOMY ABDOMINAL;  Surgeon: Myna Hidalgo, DO;  Location: [facility];  Service: Gynecology;  Laterality: N/A;   BILATERAL SALPINGECTOMY N/A 04/22/2022   Procedure: OPEN BILATERAL SALPINGECTOMY;  Surgeon: Myna Hidalgo, DO;  Location: [facility];  Service: Gynecology;  Laterality: N/A;   CESAREAN SECTION     x2   CHOLECYSTECTOMY     OOPHORECTOMY Right 04/22/2022   Procedure: RIGHT OOPHORECTOMY AND RIGHT OVARIAN CYST REMOVAL;  Surgeon: Myna Hidalgo, DO;  Location: [facility];  Service: Gynecology;  Laterality: Right;    Past Medical History:  Diagnosis Date   Anxiety    Chest pain    Complication of anesthesia    Depression    Dysuria    GERD (gastroesophageal reflux disease)    High cholesterol    Hypertension    Low back pain    PONV (postoperative nausea and vomiting)    Reviewed problem list, medications and allergies. Physical Assessment:   Vitals:   08/18/23 0941 08/18/23 0955  BP: (!) 155/101 (!) 166/100  Pulse: 63 63   Weight: 282 lb 3.2 oz (128 kg)   Height: 5' (1.524 m)   Body mass index is 55.11 kg/m.       Physical Examination:   General appearance: alert, well appearing, and in no distress  Psych: tearful and some anxious  Skin: warm & dry   Cardiovascular: normal heart rate noted  Respiratory: normal respiratory effort, no distress  Abdomen: soft, non-tender   Extremities: no edema   Chaperone: N/A    Assessment & Plan:  1) Anxiety, ?Vasomotor symptoms -Plan to add crease Lexapro to 20 mg daily -Briefly discussed HRT as an option once she has completed her second surgery -Follow-up in 3 months  2) Stage IA serous borderline tumor -Strongly encouraged patient to follow-up with Dr. Pricilla Holm for surgical intervention -Reviewed her concerns and questions -Patient concerned about postoperative recovery and reviewed today  3) Chronic HTN- elevated BP in office today -advised follow up with PCP  Meds ordered this encounter  Medications   escitalopram (LEXAPRO) 20 MG tablet    Sig: Take 1 tablet (20 mg total) by mouth daily.    Dispense:  90 tablet    Refill:  4     Return in about 3 months (around 11/18/2023) for Medication follow up, with Dr. Charlotta Newton.   Myna Hidalgo, DO Attending Obstetrician & Gynecologist, [facility] for [employer], [facility]

## 2023-08-18 NOTE — Patient Instructions (Signed)
Michele Wu  08/18/2023     @PREFPERIOPPHARMACY @   Your procedure is scheduled on 08/23/2023.   Report to [name] at  0815 A.M.   Call this number if you have problems the morning of surgery:  [phone]  If you experience any cold or flu symptoms such as cough, fever, chills, shortness of breath, etc. between now and your scheduled surgery, please notify us at the above number.   Remember:  Follow the dit instructions given to you by the office.    You may drink clear liquids until 0615 am on 08/23/2023.   Clear liquids allowed are:                    Water, Carbonated beverages (diabetics please choose diet or no sugar options), Black Coffee Only (No creamer, milk or cream, including half & half and powdered creamer), and Clear Sports drink (No red color; diabetics please choose diet or no sugar options)    Take these medicines the morning of surgery with A SIP OF WATER           alprazolam(if needed), escitalopram, metoprolol, omeprazole.     Do not wear jewelry, make-up or nail polish, including gel polish,  artificial nails, or any other type of covering on natural nails (fingers and  toes).  Do not wear lotions, powders, or perfumes, or deodorant.  Do not shave 48 hours prior to surgery.  Men may shave face and neck.  Do not bring valuables to the hospital.  [facility] is not responsible for any belongings or valuables.  Contacts, dentures or bridgework may not be worn into surgery.  Leave your suitcase in the car.  After surgery it may be brought to your room.  For patients admitted to the hospital, discharge time will be determined by your treatment team.  Patients discharged the day of surgery will not be allowed to drive home and must have someone with them for 24 hours.    Special instructions:   DO NOT smoke tobacco or vape for 24 hours before your procedure.  Please read over the following fact sheets that you were given. Anesthesia  Post-op Instructions and Care and Recovery After Surgery      Upper Endoscopy, Adult, Care After After the procedure, it is common to have a sore throat. It is also common to have: Mild stomach pain or discomfort. Bloating. Nausea. Follow these instructions at home: The instructions below may help you care for yourself at home. Your health care provider may give you more instructions. If you have questions, ask your health care provider. If you were given a sedative during the procedure, it can affect you for several hours. Do not drive or operate machinery until your health care provider says that it is safe. If you will be going home right after the procedure, plan to have a responsible adult: Take you home from the hospital or clinic. You will not be allowed to drive. Care for you for the time you are told. Follow instructions from your health care provider about what you may eat and drink. Return to your normal activities as told by your health care provider. Ask your health care provider what activities are safe for you. Take over-the-counter and prescription medicines only as told by your health care provider. Contact a health care provider if you: Have a sore throat that lasts longer than one day. Have trouble swallowing. Have a fever.  Get help right away if you: Vomit blood or your vomit looks like coffee grounds. Have bloody, black, or tarry stools. Have a very bad sore throat or you cannot swallow. Have difficulty breathing or very bad pain in your chest or abdomen. These symptoms may be an emergency. Get help right away. Call 911. Do not wait to see if the symptoms will go away. Do not drive yourself to the hospital. Summary After the procedure, it is common to have a sore throat, mild stomach discomfort, bloating, and nausea. If you were given a sedative during the procedure, it can affect you for several hours. Do not drive until your health care provider says that it is  safe. Follow instructions from your health care provider about what you may eat and drink. Return to your normal activities as told by your health care provider. This information is not intended to replace advice given to you by your health care provider. Make sure you discuss any questions you have with your health care provider. Document Revised: 01/21/2022 Document Reviewed: 01/21/2022 Elsevier Patient Education  2024 [employer] Esophageal Dilatation Esophageal dilatation, also called esophageal dilation, is a procedure to widen or open a blocked or narrowed part of the esophagus. The esophagus is the part of the body that moves food and liquid from the mouth to the stomach. You may need this procedure if: You have a buildup of scar tissue in your esophagus that makes it difficult, painful, or impossible to swallow. This can be caused by gastroesophageal reflux disease (GERD). You have cancer of the esophagus. There is a problem with how food moves through your esophagus. In some cases, you may need this procedure repeated at a later time to dilate the esophagus gradually. Tell a health care provider about: Any allergies you have. All medicines you are taking, including vitamins, herbs, eye drops, creams, and over-the-counter medicines. Any problems you or family members have had with anesthetic medicines. Any blood disorders you have. Any surgeries you have had. Any medical conditions you have. Any antibiotic medicines you are required to take before dental procedures. Whether you are pregnant or may be pregnant. What are the risks? Generally, this is a safe procedure. However, problems may occur, including: Bleeding due to a tear in the lining of the esophagus. A hole, or perforation, in the esophagus. What happens before the procedure? Ask your health care provider about: Changing or stopping your regular medicines. This is especially important if you are taking diabetes  medicines or blood thinners. Taking medicines such as aspirin and ibuprofen. These medicines can thin your blood. Do not take these medicines unless your health care provider tells you to take them. Taking over-the-counter medicines, vitamins, herbs, and supplements. Follow instructions from your health care provider about eating or drinking restrictions. Plan to have a responsible adult take you home from the hospital or clinic. Plan to have a responsible adult care for you for the time you are told after you leave the hospital or clinic. This is important. What happens during the procedure? You may be given a medicine to help you relax (sedative). A numbing medicine may be sprayed into the back of your throat, or you may gargle the medicine. Your health care provider may perform the dilatation using various surgical instruments, such as: Simple dilators. This instrument is carefully placed in the esophagus to stretch it. Guided wire bougies. This involves using an endoscope to insert a wire into the esophagus. A dilator is passed over  this wire to enlarge the esophagus. Then the wire is removed. Balloon dilators. An endoscope with a small balloon is inserted into the esophagus. The balloon is inflated to stretch the esophagus and open it up. The procedure may vary among health care providers and hospitals. What can I expect after the procedure? Your blood pressure, heart rate, breathing rate, and blood oxygen level will be monitored until you leave the hospital or clinic. Your throat may feel slightly sore and numb. This will get better over time. You will not be allowed to eat or drink until your throat is no longer numb. When you are able to drink, urinate, and sit on the edge of the bed without nausea or dizziness, you may be able to return home. Follow these instructions at home: Take over-the-counter and prescription medicines only as told by your health care provider. If you were given a  sedative during the procedure, it can affect you for several hours. Do not drive or operate machinery until your health care provider says that it is safe. Plan to have a responsible adult care for you for the time you are told. This is important. Follow instructions from your health care provider about any eating or drinking restrictions. Do not use any products that contain nicotine or tobacco, such as cigarettes, e-cigarettes, and chewing tobacco. If you need help quitting, ask your health care provider. Keep all follow-up visits. This is important. Contact a health care provider if: You have a fever. You have pain that is not relieved by medicine. Get help right away if: You have chest pain. You have trouble breathing. You have trouble swallowing. You vomit blood. You have black, tarry, or bloody stools. These symptoms may represent a serious problem that is an emergency. Do not wait to see if the symptoms will go away. Get medical help right away. Call your local emergency services (911 in the [place] Do not drive yourself to the hospital. Summary Esophageal dilatation, also called esophageal dilation, is a procedure to widen or open a blocked or narrowed part of the esophagus. Plan to have a responsible adult take you home from the hospital or clinic. For this procedure, a numbing medicine may be sprayed into the back of your throat, or you may gargle the medicine. Do not drive or operate machinery until your health care provider says that it is safe. This information is not intended to replace advice given to you by your health care provider. Make sure you discuss any questions you have with your health care provider. Document Revised: 02/28/2020 Document Reviewed: 02/28/2020 Elsevier Patient Education  2024 [employer] Monitored Anesthesia Care, Care After The following information offers guidance on how to care for yourself after your procedure. Your health care provider may also  give you more specific instructions. If you have problems or questions, contact your health care provider. What can I expect after the procedure? After the procedure, it is common to have: Tiredness. Little or no memory about what happened during or after the procedure. Impaired judgment when it comes to making decisions. Nausea or vomiting. Some trouble with balance. Follow these instructions at home: For the time period you were told by your health care provider:  Rest. Do not participate in activities where you could fall or become injured. Do not drive or use machinery. Do not drink alcohol. Do not take sleeping pills or medicines that cause drowsiness. Do not make important decisions or sign legal documents. Do not take care of  children on your own. Medicines Take over-the-counter and prescription medicines only as told by your health care provider. If you were prescribed antibiotics, take them as told by your health care provider. Do not stop using the antibiotic even if you start to feel better. Eating and drinking Follow instructions from your health care provider about what you may eat and drink. Drink enough fluid to keep your urine pale yellow. If you vomit: Drink clear fluids slowly and in small amounts as you are able. Clear fluids include water, ice chips, low-calorie sports drinks, and fruit juice that has water added to it (diluted fruit juice). Eat light and bland foods in small amounts as you are able. These foods include bananas, applesauce, rice, lean meats, toast, and crackers. General instructions  Have a responsible adult stay with you for the time you are told. It is important to have someone help care for you until you are awake and alert. If you have sleep apnea, surgery and some medicines can increase your risk for breathing problems. Follow instructions from your health care provider about wearing your sleep device: When you are sleeping. This includes  during daytime naps. While taking prescription pain medicines, sleeping medicines, or medicines that make you drowsy. Do not use any products that contain nicotine or tobacco. These products include cigarettes, chewing tobacco, and vaping devices, such as e-cigarettes. If you need help quitting, ask your health care provider. Contact a health care provider if: You feel nauseous or vomit every time you eat or drink. You feel light-headed. You are still sleepy or having trouble with balance after 24 hours. You get a rash. You have a fever. You have redness or swelling around the IV site. Get help right away if: You have trouble breathing. You have new confusion after you get home. These symptoms may be an emergency. Get help right away. Call 911. Do not wait to see if the symptoms will go away. Do not drive yourself to the hospital. This information is not intended to replace advice given to you by your health care provider. Make sure you discuss any questions you have with your health care provider. Document Revised: 03/09/2022 Document Reviewed: 03/09/2022 Elsevier Patient Education  2024 [employer].

## 2023-08-18 NOTE — Telephone Encounter (Signed)
Spoke with Michele Wu who called the office to schedule an appt. With Dr. Pricilla Holm to discuss surgery. Pt was given an appt. For Friday, January 17 th. At 3:30 pm. Pt agreed to date and time.

## 2023-08-19 ENCOUNTER — Encounter: Payer: Self-pay | Admitting: *Deleted

## 2023-08-19 ENCOUNTER — Encounter (HOSPITAL_COMMUNITY): Payer: Self-pay

## 2023-08-19 ENCOUNTER — Encounter (HOSPITAL_COMMUNITY)
Admission: RE | Admit: 2023-08-19 | Discharge: 2023-08-19 | Disposition: A | Discharge status: Home / Self Care | Payer: Medicaid Other | Source: Ambulatory Visit | Attending: Internal Medicine | Admitting: Internal Medicine

## 2023-08-19 ENCOUNTER — Telehealth: Payer: Self-pay | Admitting: *Deleted

## 2023-08-19 DIAGNOSIS — I10 Essential (primary) hypertension: Secondary | ICD-10-CM

## 2023-08-19 NOTE — Telephone Encounter (Signed)
no show Received: Today Elsie Amis, RN  [place]studillo, Ewell Poe, CMA; Marlowe Shores, LPN; Elinor Dodge, LPN Good afternoon! Michele Wu did not show for her pre-op today.  Pt says forgot about pre-op appointment. She has been rescheduled until 09/10/23 at 2:15 pm. Updated instructions sent via mychart.

## 2023-08-23 ENCOUNTER — Encounter: Payer: Self-pay | Admitting: *Deleted

## 2023-09-03 NOTE — Patient Instructions (Signed)
Michele Wu  09/03/2023     @PREFPERIOPPHARMACY @   Your procedure is scheduled on  09/10/2023.   Report to [name] at  1215  P.M.   Call this number if you have problems the morning of surgery:  [phone]  If you experience any cold or flu symptoms such as cough, fever, chills, shortness of breath, etc. between now and your scheduled surgery, please notify us at the above number.   Remember:  Follow the diet instructions given to you by the office.    You may drink clear liquids until 1015 am on 09/10/2023.    Clear liquids allowed are:                    Water, Juice (No red color; non-citric and without pulp; diabetics please choose diet or no sugar options), Carbonated beverages (diabetics please choose diet or no sugar options), Clear Tea (No creamer, milk, or cream, including half & half and powdered creamer), Black Coffee Only (No creamer, milk or cream, including half & half and powdered creamer), Plain Jell-O Only (No red color; diabetics please choose no sugar options), Clear Sports drink (No red color; diabetics please choose diet or no sugar options), and Plain Popsicles Only (No red color; diabetics please choose no sugar options)    Take these medicines the morning of surgery with A SIP OF WATER            alprazolam(if needed), escitalopram, metoprolol, omeprazole.     Do not wear jewelry, make-up or nail polish, including gel polish,  artificial nails, or any other type of covering on natural nails (fingers and  toes).  Do not wear lotions, powders, or perfumes, or deodorant.  Do not shave 48 hours prior to surgery.  Men may shave face and neck.  Do not bring valuables to the hospital.  [facility] is not responsible for any belongings or valuables.  Contacts, dentures or bridgework may not be worn into surgery.  Leave your suitcase in the car.  After surgery it may be brought to your room.  For patients admitted to the hospital,  discharge time will be determined by your treatment team.  Patients discharged the day of surgery will not be allowed to drive home and must have someone with them for 24 hours.    Special instructions:   DO NOT smoke tobacco or vape for 24 hours before your procedure.  Please read over the following fact sheets that you were given. Anesthesia Post-op Instructions and Care and Recovery After Surgery      Upper Endoscopy, Adult, Care After After the procedure, it is common to have a sore throat. It is also common to have: Mild stomach pain or discomfort. Bloating. Nausea. Follow these instructions at home: The instructions below may help you care for yourself at home. Your health care provider may give you more instructions. If you have questions, ask your health care provider. If you were given a sedative during the procedure, it can affect you for several hours. Do not drive or operate machinery until your health care provider says that it is safe. If you will be going home right after the procedure, plan to have a responsible adult: Take you home from the hospital or clinic. You will not be allowed to drive. Care for you for the time you are told. Follow instructions from your health care provider about what you may eat and  drink. Return to your normal activities as told by your health care provider. Ask your health care provider what activities are safe for you. Take over-the-counter and prescription medicines only as told by your health care provider. Contact a health care provider if you: Have a sore throat that lasts longer than one day. Have trouble swallowing. Have a fever. Get help right away if you: Vomit blood or your vomit looks like coffee grounds. Have bloody, black, or tarry stools. Have a very bad sore throat or you cannot swallow. Have difficulty breathing or very bad pain in your chest or abdomen. These symptoms may be an emergency. Get help right away. Call  911. Do not wait to see if the symptoms will go away. Do not drive yourself to the hospital. Summary After the procedure, it is common to have a sore throat, mild stomach discomfort, bloating, and nausea. If you were given a sedative during the procedure, it can affect you for several hours. Do not drive until your health care provider says that it is safe. Follow instructions from your health care provider about what you may eat and drink. Return to your normal activities as told by your health care provider. This information is not intended to replace advice given to you by your health care provider. Make sure you discuss any questions you have with your health care provider. Document Revised: 01/21/2022 Document Reviewed: 01/21/2022 Elsevier Patient Education  2024 [employer] Esophageal Dilatation Esophageal dilatation, also called esophageal dilation, is a procedure to widen or open a blocked or narrowed part of the esophagus. The esophagus is the part of the body that moves food and liquid from the mouth to the stomach. You may need this procedure if: You have a buildup of scar tissue in your esophagus that makes it difficult, painful, or impossible to swallow. This can be caused by gastroesophageal reflux disease (GERD). You have cancer of the esophagus. There is a problem with how food moves through your esophagus. In some cases, you may need this procedure repeated at a later time to dilate the esophagus gradually. Tell a health care provider about: Any allergies you have. All medicines you are taking, including vitamins, herbs, eye drops, creams, and over-the-counter medicines. Any problems you or family members have had with anesthetic medicines. Any blood disorders you have. Any surgeries you have had. Any medical conditions you have. Any antibiotic medicines you are required to take before dental procedures. Whether you are pregnant or may be pregnant. What are the  risks? Generally, this is a safe procedure. However, problems may occur, including: Bleeding due to a tear in the lining of the esophagus. A hole, or perforation, in the esophagus. What happens before the procedure? Ask your health care provider about: Changing or stopping your regular medicines. This is especially important if you are taking diabetes medicines or blood thinners. Taking medicines such as aspirin and ibuprofen. These medicines can thin your blood. Do not take these medicines unless your health care provider tells you to take them. Taking over-the-counter medicines, vitamins, herbs, and supplements. Follow instructions from your health care provider about eating or drinking restrictions. Plan to have a responsible adult take you home from the hospital or clinic. Plan to have a responsible adult care for you for the time you are told after you leave the hospital or clinic. This is important. What happens during the procedure? You may be given a medicine to help you relax (sedative). A numbing medicine may be  sprayed into the back of your throat, or you may gargle the medicine. Your health care provider may perform the dilatation using various surgical instruments, such as: Simple dilators. This instrument is carefully placed in the esophagus to stretch it. Guided wire bougies. This involves using an endoscope to insert a wire into the esophagus. A dilator is passed over this wire to enlarge the esophagus. Then the wire is removed. Balloon dilators. An endoscope with a small balloon is inserted into the esophagus. The balloon is inflated to stretch the esophagus and open it up. The procedure may vary among health care providers and hospitals. What can I expect after the procedure? Your blood pressure, heart rate, breathing rate, and blood oxygen level will be monitored until you leave the hospital or clinic. Your throat may feel slightly sore and numb. This will get better over  time. You will not be allowed to eat or drink until your throat is no longer numb. When you are able to drink, urinate, and sit on the edge of the bed without nausea or dizziness, you may be able to return home. Follow these instructions at home: Take over-the-counter and prescription medicines only as told by your health care provider. If you were given a sedative during the procedure, it can affect you for several hours. Do not drive or operate machinery until your health care provider says that it is safe. Plan to have a responsible adult care for you for the time you are told. This is important. Follow instructions from your health care provider about any eating or drinking restrictions. Do not use any products that contain nicotine or tobacco, such as cigarettes, e-cigarettes, and chewing tobacco. If you need help quitting, ask your health care provider. Keep all follow-up visits. This is important. Contact a health care provider if: You have a fever. You have pain that is not relieved by medicine. Get help right away if: You have chest pain. You have trouble breathing. You have trouble swallowing. You vomit blood. You have black, tarry, or bloody stools. These symptoms may represent a serious problem that is an emergency. Do not wait to see if the symptoms will go away. Get medical help right away. Call your local emergency services (911 in the [place] Do not drive yourself to the hospital. Summary Esophageal dilatation, also called esophageal dilation, is a procedure to widen or open a blocked or narrowed part of the esophagus. Plan to have a responsible adult take you home from the hospital or clinic. For this procedure, a numbing medicine may be sprayed into the back of your throat, or you may gargle the medicine. Do not drive or operate machinery until your health care provider says that it is safe. This information is not intended to replace advice given to you by your health care  provider. Make sure you discuss any questions you have with your health care provider. Document Revised: 02/28/2020 Document Reviewed: 02/28/2020 Elsevier Patient Education  2024 [employer] Monitored Anesthesia Care, Care After The following information offers guidance on how to care for yourself after your procedure. Your health care provider may also give you more specific instructions. If you have problems or questions, contact your health care provider. What can I expect after the procedure? After the procedure, it is common to have: Tiredness. Little or no memory about what happened during or after the procedure. Impaired judgment when it comes to making decisions. Nausea or vomiting. Some trouble with balance. Follow these instructions at home:  For the time period you were told by your health care provider:  Rest. Do not participate in activities where you could fall or become injured. Do not drive or use machinery. Do not drink alcohol. Do not take sleeping pills or medicines that cause drowsiness. Do not make important decisions or sign legal documents. Do not take care of children on your own. Medicines Take over-the-counter and prescription medicines only as told by your health care provider. If you were prescribed antibiotics, take them as told by your health care provider. Do not stop using the antibiotic even if you start to feel better. Eating and drinking Follow instructions from your health care provider about what you may eat and drink. Drink enough fluid to keep your urine pale yellow. If you vomit: Drink clear fluids slowly and in small amounts as you are able. Clear fluids include water, ice chips, low-calorie sports drinks, and fruit juice that has water added to it (diluted fruit juice). Eat light and bland foods in small amounts as you are able. These foods include bananas, applesauce, rice, lean meats, toast, and crackers. General instructions  Have a  responsible adult stay with you for the time you are told. It is important to have someone help care for you until you are awake and alert. If you have sleep apnea, surgery and some medicines can increase your risk for breathing problems. Follow instructions from your health care provider about wearing your sleep device: When you are sleeping. This includes during daytime naps. While taking prescription pain medicines, sleeping medicines, or medicines that make you drowsy. Do not use any products that contain nicotine or tobacco. These products include cigarettes, chewing tobacco, and vaping devices, such as e-cigarettes. If you need help quitting, ask your health care provider. Contact a health care provider if: You feel nauseous or vomit every time you eat or drink. You feel light-headed. You are still sleepy or having trouble with balance after 24 hours. You get a rash. You have a fever. You have redness or swelling around the IV site. Get help right away if: You have trouble breathing. You have new confusion after you get home. These symptoms may be an emergency. Get help right away. Call 911. Do not wait to see if the symptoms will go away. Do not drive yourself to the hospital. This information is not intended to replace advice given to you by your health care provider. Make sure you discuss any questions you have with your health care provider. Document Revised: 03/09/2022 Document Reviewed: 03/09/2022 Elsevier Patient Education  2024 [employer].

## 2023-09-06 ENCOUNTER — Encounter: Payer: Self-pay | Admitting: *Deleted

## 2023-09-06 ENCOUNTER — Encounter (HOSPITAL_COMMUNITY)
Admission: RE | Admit: 2023-09-06 | Discharge: 2023-09-06 | Disposition: A | Discharge status: Home / Self Care | Payer: Medicaid Other | Source: Ambulatory Visit | Attending: Internal Medicine | Admitting: Internal Medicine

## 2023-09-06 ENCOUNTER — Telehealth: Payer: Self-pay | Admitting: *Deleted

## 2023-09-06 NOTE — Telephone Encounter (Signed)
Pt left vm stating that she is sick and needs to reschedule her procedure

## 2023-09-06 NOTE — Telephone Encounter (Signed)
Pt has been rescheduled to 10/05/23. Updated instructions mailed to pt

## 2023-09-06 NOTE — Telephone Encounter (Signed)
no show Received: Today Elsie Amis, RN  Estudillo, Ewell Poe, CMA; Marlowe Shores, LPN; Everlena Cooper, Kendle Erker L, LPN Good morning again! Michele Wu did not show for her pre-op this morning. Thank you.

## 2023-09-06 NOTE — Progress Notes (Signed)
Pt called and stated she was not coming for her PAT appt that was scheduled for 0800. Stated she was sick. Instructed her to call Dr Darolyn Rua office and notify them to reschedule. Voiced understanding.

## 2023-09-08 ENCOUNTER — Encounter (HOSPITAL_COMMUNITY): Payer: Medicaid Other

## 2023-09-20 ENCOUNTER — Encounter: Payer: Self-pay | Admitting: Gastroenterology

## 2023-09-30 NOTE — Patient Instructions (Signed)
Michele Wu  09/30/2023     @PREFPERIOPPHARMACY @   Your procedure is scheduled on  10/05/2023.   Report to [name] at  0700  A.M.   Call this number if you have problems the morning of surgery:  [phone]  If you experience any cold or flu symptoms such as cough, fever, chills, shortness of breath, etc. between now and your scheduled surgery, please notify us at the above number.   Remember:  Follow the diet instructions given to you by the office.    You may drink clear liquids until 0500 am on 10/05/2023.    Clear liquids allowed are:                    Water, Juice (No red color; non-citric and without pulp; diabetics please choose diet or no sugar options), Carbonated beverages (diabetics please choose diet or no sugar options), Clear Tea (No creamer, milk, or cream, including half & half and powdered creamer), Black Coffee Only (No creamer, milk or cream, including half & half and powdered creamer), and Clear Sports drink (No red color; diabetics please choose diet or no sugar options)    Take these medicines the morning of surgery with A SIP OF WATER          alprazolam (if needed),escitalopram, metoprolol, omeprazole.     Do not wear jewelry, make-up or nail polish, including gel polish,  artificial nails, or any other type of covering on natural nails (fingers and  toes).  Do not wear lotions, powders, or perfumes, or deodorant.  Do not shave 48 hours prior to surgery.  Men may shave face and neck.  Do not bring valuables to the hospital.  [facility] is not responsible for any belongings or valuables.  Contacts, dentures or bridgework may not be worn into surgery.  Leave your suitcase in the car.  After surgery it may be brought to your room.  For patients admitted to the hospital, discharge time will be determined by your treatment team.  Patients discharged the day of surgery will not be allowed to drive home and must have someone with them  for 24 hours.    Special instructions:  DO NOT smoke tobacco or vape for 24 hours before your procedure.  Please read over the following fact sheets that you were given. Anesthesia Post-op Instructions and Care and Recovery After Surgery        Upper Endoscopy, Adult, Care After After the procedure, it is common to have a sore throat. It is also common to have: Mild stomach pain or discomfort. Bloating. Nausea. Follow these instructions at home: The instructions below may help you care for yourself at home. Your health care provider may give you more instructions. If you have questions, ask your health care provider. If you were given a sedative during the procedure, it can affect you for several hours. Do not drive or operate machinery until your health care provider says that it is safe. If you will be going home right after the procedure, plan to have a responsible adult: Take you home from the hospital or clinic. You will not be allowed to drive. Care for you for the time you are told. Follow instructions from your health care provider about what you may eat and drink. Return to your normal activities as told by your health care provider. Ask your health care provider what activities are safe for you. Take over-the-counter and  prescription medicines only as told by your health care provider. Contact a health care provider if you: Have a sore throat that lasts longer than one day. Have trouble swallowing. Have a fever. Get help right away if you: Vomit blood or your vomit looks like coffee grounds. Have bloody, black, or tarry stools. Have a very bad sore throat or you cannot swallow. Have difficulty breathing or very bad pain in your chest or abdomen. These symptoms may be an emergency. Get help right away. Call 911. Do not wait to see if the symptoms will go away. Do not drive yourself to the hospital. Summary After the procedure, it is common to have a sore throat, mild  stomach discomfort, bloating, and nausea. If you were given a sedative during the procedure, it can affect you for several hours. Do not drive until your health care provider says that it is safe. Follow instructions from your health care provider about what you may eat and drink. Return to your normal activities as told by your health care provider. This information is not intended to replace advice given to you by your health care provider. Make sure you discuss any questions you have with your health care provider. Document Revised: 01/21/2022 Document Reviewed: 01/21/2022 Elsevier Patient Education  2024 Elsevier Inc. Esophageal Dilatation Esophageal dilatation, also called esophageal dilation, is a procedure to widen or open a blocked or narrowed part of the esophagus. The esophagus is the part of the body that moves food and liquid from the mouth to the stomach. You may need this procedure if: You have a buildup of scar tissue in your esophagus that makes it difficult, painful, or impossible to swallow. This can be caused by gastroesophageal reflux disease (GERD). You have cancer of the esophagus. There is a problem with how food moves through your esophagus. In some cases, you may need this procedure repeated at a later time to dilate the esophagus gradually. Tell a health care provider about: Any allergies you have. All medicines you are taking, including vitamins, herbs, eye drops, creams, and over-the-counter medicines. Any problems you or family members have had with anesthetic medicines. Any blood disorders you have. Any surgeries you have had. Any medical conditions you have. Any antibiotic medicines you are required to take before dental procedures. Whether you are pregnant or may be pregnant. What are the risks? Generally, this is a safe procedure. However, problems may occur, including: Bleeding due to a tear in the lining of the esophagus. A hole, or perforation, in the  esophagus. What happens before the procedure? Ask your health care provider about: Changing or stopping your regular medicines. This is especially important if you are taking diabetes medicines or blood thinners. Taking medicines such as aspirin and ibuprofen. These medicines can thin your blood. Do not take these medicines unless your health care provider tells you to take them. Taking over-the-counter medicines, vitamins, herbs, and supplements. Follow instructions from your health care provider about eating or drinking restrictions. Plan to have a responsible adult take you home from the hospital or clinic. Plan to have a responsible adult care for you for the time you are told after you leave the hospital or clinic. This is important. What happens during the procedure? You may be given a medicine to help you relax (sedative). A numbing medicine may be sprayed into the back of your throat, or you may gargle the medicine. Your health care provider may perform the dilatation using various surgical instruments, such as:  Simple dilators. This instrument is carefully placed in the esophagus to stretch it. Guided wire bougies. This involves using an endoscope to insert a wire into the esophagus. A dilator is passed over this wire to enlarge the esophagus. Then the wire is removed. Balloon dilators. An endoscope with a small balloon is inserted into the esophagus. The balloon is inflated to stretch the esophagus and open it up. The procedure may vary among health care providers and hospitals. What can I expect after the procedure? Your blood pressure, heart rate, breathing rate, and blood oxygen level will be monitored until you leave the hospital or clinic. Your throat may feel slightly sore and numb. This will get better over time. You will not be allowed to eat or drink until your throat is no longer numb. When you are able to drink, urinate, and sit on the edge of the bed without nausea or  dizziness, you may be able to return home. Follow these instructions at home: Take over-the-counter and prescription medicines only as told by your health care provider. If you were given a sedative during the procedure, it can affect you for several hours. Do not drive or operate machinery until your health care provider says that it is safe. Plan to have a responsible adult care for you for the time you are told. This is important. Follow instructions from your health care provider about any eating or drinking restrictions. Do not use any products that contain nicotine or tobacco, such as cigarettes, e-cigarettes, and chewing tobacco. If you need help quitting, ask your health care provider. Keep all follow-up visits. This is important. Contact a health care provider if: You have a fever. You have pain that is not relieved by medicine. Get help right away if: You have chest pain. You have trouble breathing. You have trouble swallowing. You vomit blood. You have black, tarry, or bloody stools. These symptoms may represent a serious problem that is an emergency. Do not wait to see if the symptoms will go away. Get medical help right away. Call your local emergency services (911 in the [place] Do not drive yourself to the hospital. Summary Esophageal dilatation, also called esophageal dilation, is a procedure to widen or open a blocked or narrowed part of the esophagus. Plan to have a responsible adult take you home from the hospital or clinic. For this procedure, a numbing medicine may be sprayed into the back of your throat, or you may gargle the medicine. Do not drive or operate machinery until your health care provider says that it is safe. This information is not intended to replace advice given to you by your health care provider. Make sure you discuss any questions you have with your health care provider. Document Revised: 02/28/2020 Document Reviewed: 02/28/2020 Elsevier Patient  Education  2024 Elsevier Inc. Monitored Anesthesia Care, Care After The following information offers guidance on how to care for yourself after your procedure. Your health care provider may also give you more specific instructions. If you have problems or questions, contact your health care provider. What can I expect after the procedure? After the procedure, it is common to have: Tiredness. Little or no memory about what happened during or after the procedure. Impaired judgment when it comes to making decisions. Nausea or vomiting. Some trouble with balance. Follow these instructions at home: For the time period you were told by your health care provider:  Rest. Do not participate in activities where you could fall or become injured. Do  not drive or use machinery. Do not drink alcohol. Do not take sleeping pills or medicines that cause drowsiness. Do not make important decisions or sign legal documents. Do not take care of children on your own. Medicines Take over-the-counter and prescription medicines only as told by your health care provider. If you were prescribed antibiotics, take them as told by your health care provider. Do not stop using the antibiotic even if you start to feel better. Eating and drinking Follow instructions from your health care provider about what you may eat and drink. Drink enough fluid to keep your urine pale yellow. If you vomit: Drink clear fluids slowly and in small amounts as you are able. Clear fluids include water, ice chips, low-calorie sports drinks, and fruit juice that has water added to it (diluted fruit juice). Eat light and bland foods in small amounts as you are able. These foods include bananas, applesauce, rice, lean meats, toast, and crackers. General instructions  Have a responsible adult stay with you for the time you are told. It is important to have someone help care for you until you are awake and alert. If you have sleep apnea,  surgery and some medicines can increase your risk for breathing problems. Follow instructions from your health care provider about wearing your sleep device: When you are sleeping. This includes during daytime naps. While taking prescription pain medicines, sleeping medicines, or medicines that make you drowsy. Do not use any products that contain nicotine or tobacco. These products include cigarettes, chewing tobacco, and vaping devices, such as e-cigarettes. If you need help quitting, ask your health care provider. Contact a health care provider if: You feel nauseous or vomit every time you eat or drink. You feel light-headed. You are still sleepy or having trouble with balance after 24 hours. You get a rash. You have a fever. You have redness or swelling around the IV site. Get help right away if: You have trouble breathing. You have new confusion after you get home. These symptoms may be an emergency. Get help right away. Call 911. Do not wait to see if the symptoms will go away. Do not drive yourself to the hospital. This information is not intended to replace advice given to you by your health care provider. Make sure you discuss any questions you have with your health care provider. Document Revised: 03/09/2022 Document Reviewed: 03/09/2022 Elsevier Patient Education  2024 [employer].

## 2023-10-01 ENCOUNTER — Encounter (HOSPITAL_COMMUNITY): Payer: Self-pay

## 2023-10-01 ENCOUNTER — Encounter (HOSPITAL_COMMUNITY)
Admission: RE | Admit: 2023-10-01 | Discharge: 2023-10-01 | Disposition: A | Discharge status: Home / Self Care | Payer: Medicaid Other | Source: Ambulatory Visit | Attending: Internal Medicine | Admitting: Internal Medicine

## 2023-10-05 ENCOUNTER — Encounter (HOSPITAL_COMMUNITY): Admission: RE | Payer: Self-pay | Source: Home / Self Care

## 2023-10-05 ENCOUNTER — Encounter (HOSPITAL_COMMUNITY): Payer: Self-pay | Admitting: Anesthesiology

## 2023-10-05 ENCOUNTER — Ambulatory Visit (HOSPITAL_COMMUNITY): Admission: RE | Admit: 2023-10-05 | Payer: Medicaid Other | Source: Home / Self Care

## 2023-10-05 SURGERY — ESOPHAGOGASTRODUODENOSCOPY (EGD) WITH PROPOFOL
Anesthesia: Monitor Anesthesia Care

## 2023-10-05 NOTE — OR Nursing (Signed)
No show for procedure.

## 2023-11-01 ENCOUNTER — Ambulatory Visit (HOSPITAL_BASED_OUTPATIENT_CLINIC_OR_DEPARTMENT_OTHER): Payer: Medicaid Other | Attending: Physician Assistant | Admitting: Internal Medicine

## 2023-11-01 ENCOUNTER — Encounter (HOSPITAL_BASED_OUTPATIENT_CLINIC_OR_DEPARTMENT_OTHER): Payer: Self-pay

## 2023-11-10 ENCOUNTER — Encounter: Payer: Self-pay | Admitting: Gynecologic Oncology

## 2023-11-12 ENCOUNTER — Encounter: Payer: Self-pay | Admitting: Gynecologic Oncology

## 2023-11-12 ENCOUNTER — Inpatient Hospital Stay: Payer: Medicaid Other

## 2023-11-12 ENCOUNTER — Inpatient Hospital Stay: Payer: Medicaid Other | Attending: Gynecologic Oncology | Admitting: Gynecologic Oncology

## 2023-11-12 VITALS — BP 155/118 | HR 71 | Temp 98.4°F | Resp 20 | Wt 275.4 lb

## 2023-11-12 DIAGNOSIS — D3911 Neoplasm of uncertain behavior of right ovary: Secondary | ICD-10-CM

## 2023-11-12 DIAGNOSIS — Z9079 Acquired absence of other genital organ(s): Secondary | ICD-10-CM | POA: Insufficient documentation

## 2023-11-12 DIAGNOSIS — Z7189 Other specified counseling: Secondary | ICD-10-CM | POA: Diagnosis not present

## 2023-11-12 DIAGNOSIS — N941 Unspecified dyspareunia: Secondary | ICD-10-CM | POA: Insufficient documentation

## 2023-11-12 DIAGNOSIS — R3989 Other symptoms and signs involving the genitourinary system: Secondary | ICD-10-CM

## 2023-11-12 DIAGNOSIS — R232 Flushing: Secondary | ICD-10-CM

## 2023-11-12 DIAGNOSIS — Z90710 Acquired absence of both cervix and uterus: Secondary | ICD-10-CM | POA: Diagnosis not present

## 2023-11-12 DIAGNOSIS — Z90722 Acquired absence of ovaries, bilateral: Secondary | ICD-10-CM | POA: Insufficient documentation

## 2023-11-12 DIAGNOSIS — D4959 Neoplasm of unspecified behavior of other genitourinary organ: Secondary | ICD-10-CM | POA: Insufficient documentation

## 2023-11-12 DIAGNOSIS — R102 Pelvic and perineal pain: Secondary | ICD-10-CM | POA: Diagnosis not present

## 2023-11-12 DIAGNOSIS — I10 Essential (primary) hypertension: Secondary | ICD-10-CM | POA: Diagnosis not present

## 2023-11-12 NOTE — Progress Notes (Signed)
Gynecologic Oncology Return Clinic Visit  11/12/23  Reason for Visit: treatment planning   Treatment History: Patient developed irregular menses in December 2022 with abnormal uterine bleeding.  Would miss several months between menses, when she would bleeding, menses often lasted much longer and were heavier.   Pelvic ultrasound on 12/05/2021 revealed uterus measuring 7.9 x 3.3 x 5.3 cm.  Endometrial lining 4 mm.  Right ovary measures up to 3 cm and is normal in appearance.  Left ovary measures up to 3.4 cm.  Complicated cyst within the left ovary measures up to 3.3 cm containing multiple thin septations, no discrete mural nodules.  Complicated cystic lesion identified in the right adnexa, superior to the right ovary measuring up to 4.2 cm with an avascular, intracystic mural nodule measuring 14 x 6 x 11 mm.   Repeat pelvic ultrasound exam on 02/10/2022 reveals right ovary measures up to 2.6 cm and is normal in appearance.  There is a 4.1 x 3.1 x 3.8 cm complex right adnexal cyst with known vascular nodule measuring up to 1 cm.  Left ovary measures up to 3.1 cm with a septated complex left ovarian cyst measuring 2.3 x 1.8 x 1.1 cm.   In 02/2022, ROMA low risk. CA-125 was 30.7.   On 6/28, the patient underwent TAH/BS, right ovarian cystectomy and oophorectomy.  Findings at the time of surgery were a normal uterus and bilateral fallopian tubes. 4cm paratubal right ovarian cyst.  Normal left ovary.   CA-125 on 7/5 was 128.   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 Cytology was negative.    CT A/P on 06/12/2022 revealed no evidence of adenopathy or metastatic disease.  Postsurgical changes noted.   CA-125 on 09/08/22: 9.6.  Interval History: Overall doing well.  Notes fatigue.  Has occasional abdominal/pelvic pain, unchanged in frequency.  Continues to have pain in her bladder when she needs to urinate, unchanged since last time I saw her.  Endorses  baseline bowel function.  Past Medical/Surgical History: Past Medical History:  Diagnosis Date   Anxiety    Chest pain    Complication of anesthesia    Depression    Dysuria    GERD (gastroesophageal reflux disease)    High cholesterol    Hypertension    Low back pain    PONV (postoperative nausea and vomiting)     Past Surgical History:  Procedure Laterality Date   ABDOMINAL HYSTERECTOMY N/A 04/22/2022   Procedure: HYSTERECTOMY ABDOMINAL;  Surgeon: Myna Hidalgo, DO;  Location: [facility];  Service: Gynecology;  Laterality: N/A;   BILATERAL SALPINGECTOMY N/A 04/22/2022   Procedure: OPEN BILATERAL SALPINGECTOMY;  Surgeon: Myna Hidalgo, DO;  Location: [facility];  Service: Gynecology;  Laterality: N/A;   CESAREAN SECTION     x2   CHOLECYSTECTOMY     OOPHORECTOMY Right 04/22/2022   Procedure: RIGHT OOPHORECTOMY AND RIGHT OVARIAN CYST REMOVAL;  Surgeon: Myna Hidalgo, DO;  Location: [facility];  Service: Gynecology;  Laterality: Right;    Family History  Adopted: Yes  Family history unknown: Yes    Social History   Socioeconomic History   Marital status: Married    Spouse name: Not on file   Number of children: 2   Years of education: Not on file   Highest education level: Not on file  Occupational History   Occupation: home health care  Tobacco Use   Smoking status: Never   Smokeless tobacco: Never  Vaping Use  Vaping status: Never Used  Substance and Sexual Activity   Alcohol use: Never   Drug use: Never   Sexual activity: Yes    Birth control/protection: Surgical    Comment: tubal/hyst  Other Topics Concern   Not on file  Social History Narrative   Not on file   Social Drivers of Health   Financial Resource Strain: Low Risk  (07/29/2023)   Overall Financial Resource Strain (CARDIA)    Difficulty of Paying Living Expenses: Not very hard  Food Insecurity: Food Insecurity Present (07/29/2023)   Hunger Vital Sign    Worried About Running Out of Food in the Last  Year: Often true    Ran Out of Food in the Last Year: Often true  Transportation Needs: No Transportation Needs (07/29/2023)   PRAPARE - Administrator, Civil Service (Medical): No    Lack of Transportation (Non-Medical): No  Physical Activity: Insufficiently Active (07/29/2023)   Exercise Vital Sign    Days of Exercise per Week: 5 days    Minutes of Exercise per Session: 20 min  Stress: Stress Concern Present (07/29/2023)   [employer] of Occupational Health - Occupational Stress Questionnaire    Feeling of Stress : Very much  Social Connections: Moderately Integrated (07/29/2023)   Social Connection and Isolation Panel [NHANES]    Frequency of Communication with Friends and Family: More than three times a week    Frequency of Social Gatherings with Friends and Family: Once a week    Attends Religious Services: More than 4 times per year    Active Member of [employer] or Organizations: No    Attends Banker Meetings: Never    Marital Status: Married    Current Medications:  Current Outpatient Medications:    ALPRAZolam (XANAX) 1 MG tablet, Take 1 mg by mouth 2 (two) times daily as needed for anxiety., Disp: , Rfl:    losartan-hydrochlorothiazide (HYZAAR) 100-12.5 MG tablet, Take 1 tablet by mouth daily., Disp: , Rfl:    metoprolol succinate (TOPROL-XL) 50 MG 24 hr tablet, Take 50 mg by mouth daily., Disp: , Rfl:    omeprazole (PRILOSEC) 20 MG capsule, Take 20 mg by mouth daily., Disp: , Rfl:    simvastatin (ZOCOR) 40 MG tablet, Take 40 mg by mouth at bedtime., Disp: , Rfl:   Review of Systems: + Fatigue, ringing in ears, dyspareunia, frequency, discharge, joint pain, headache, depression Denies appetite changes, fevers, chills, unexplained weight changes. Denies hearing loss, neck lumps or masses, mouth sores or voice changes. Denies cough or wheezing.  Denies shortness of breath. Denies chest pain or palpitations. Denies leg swelling. Denies abdominal  distention, pain, blood in stools, constipation, diarrhea, nausea, vomiting, or early satiety. Denies dysuria, hematuria or incontinence. Denies hot flashes, pelvic pain, vaginal bleeding.   Denies back pain or muscle pain/cramps. Denies itching, rash, or wounds. Denies dizziness, numbness or seizures. Denies swollen lymph nodes or glands, denies easy bruising or bleeding. Denies anxiety, confusion, or decreased concentration.  Physical Exam: BP (!) 159/107 (BP Location: Left Arm, Patient Position: Sitting) Comment: Notified RN  Pulse 71   Temp 98.4 F (36.9 C) (Oral)   Resp 20   Wt 275 lb 6.4 oz (124.9 kg)   LMP 03/09/2022 (Exact Date) Comment: hcg negative 04/20/2022  SpO2 100%   BMI 53.79 kg/m  General: Alert, oriented, no acute distress. HEENT: Atraumatic, normocephalic, sclera anicteric. Chest: Clear to auscultation bilaterally.  No wheezes or rhonchi. Cardiovascular: Regular rate and rhythm, no  murmurs. Abdomen: Obese, soft, nontender.  Normoactive bowel sounds.  No masses or hepatosplenomegaly appreciated.   Extremities: Grossly normal range of motion.  Warm, well perfused.  No edema bilaterally. Skin: No rashes or lesions noted. Lymphatics: No cervical, supraclavicular, or inguinal adenopathy. GU: Normal appearing external genitalia without erythema, excoriation, or lesions.  Speculum exam reveals well rugated vaginal mucosa, no lesions, cuff intact.  Bimanual exam reveals tenderness along palpation of the pelvic floor muscles, no masses or nodularity.    Laboratory & Radiologic Studies: 07/2023: Pelvic ultrasound FINDINGS: Uterus: Status post hysterectomy Right ovary Surgically removed. Left ovary Septated cyst measures 1.8 cm. Ovary measurements are 3.2 x 1.6 x 1.1 cm. Images of the adnexae demonstrated no masses or fluid collections. IMPRESSION: 1. Status post hysterectomy and right oophorectomy. 2. Left ovarian cyst.  Assessment & Plan: Michele Wu is a  43 y.o. 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   With regards to her history of borderline tumor of a paratubal cyst, she voices being ready to move forward with completion surgery.  Will involve removal of her remaining ovary, peritoneal biopsies and omental biopsy.  We discussed scheduling surgery in mid March.  Plan to repeat pelvic ultrasound in late February, CA125 today.  Given her ongoing bladder pain, discussed referral to urology.  She has had no change in her symptoms, which developed after her hysterectomy.  I have asked my clinic to send over referral.  We discussed her dyspareunia and pelvic pain.  She has pain with palpation on pelvic exam.  I suspect she would benefit from pelvic floor physical therapy.  We discussed referral for this now versus after surgery.  Her preference is to wait until after surgery.  Her blood pressure was elevated today.  She denied any visual symptoms, but endorses ringing in ears and headache.  Recheck was in the 160s over 120.  Recommended urgent evaluation, such as urgent care.  Patient has blood pressure cuff at home and notes that her blood pressure is usually much lower at home.   She continues to have some symptoms consistent with menopause.  We had previously discussed possibility of starting hormonal therapy but she would like to wait until after surgery.  28 minutes of total time was spent for this patient encounter, including preparation, face-to-face counseling with the patient and coordination of care, and documentation of the encounter.  Eugene Garnet, MD  Division of Gynecologic Oncology  Department of Obstetrics and Gynecology  [facility]

## 2023-11-12 NOTE — Patient Instructions (Addendum)
Plan on having a CA 125 today and an ultrasound. You will be notified of the results.   We will also place a referral for you to meet with a urologist at [facility]. You should receive a phone call with this appointment.   We will tentatively plan for surgery at [facility] with Dr. Eugene Garnet on January 05, 2024. We will see you back in the office closer to the date for a preop appointment with Dr. Pricilla Holm and Warner Mccreedy NP to discuss the instructions for before and after surgery.  You may also receive a phone call from the hospital to arrange for a pre-op appointment there as well. Usually both appointments can be combined on the same day.

## 2023-11-13 LAB — CA 125: Cancer Antigen (CA) 125: 9.6 U/mL (ref 0.0–38.1)

## 2023-11-14 ENCOUNTER — Encounter: Payer: Self-pay | Admitting: Gynecologic Oncology

## 2023-11-15 ENCOUNTER — Telehealth: Payer: Self-pay | Admitting: *Deleted

## 2023-11-15 NOTE — Telephone Encounter (Signed)
Per Dr Tucker fax records and referral form to [facility] 

## 2023-11-17 ENCOUNTER — Other Ambulatory Visit (HOSPITAL_COMMUNITY): Payer: Self-pay | Admitting: Physician Assistant

## 2023-11-17 DIAGNOSIS — R519 Headache, unspecified: Secondary | ICD-10-CM

## 2023-11-21 ENCOUNTER — Ambulatory Visit (HOSPITAL_COMMUNITY): Admission: RE | Admit: 2023-11-21 | Payer: Medicaid Other | Source: Ambulatory Visit

## 2023-11-22 NOTE — Telephone Encounter (Signed)
Sent the patient a my chart message regarding the referral to [facility]; explained that the office was trying to reach her. Ask the patient to call that office for an appointment.

## 2023-11-24 ENCOUNTER — Ambulatory Visit (HOSPITAL_COMMUNITY)
Admission: RE | Admit: 2023-11-24 | Discharge: 2023-11-24 | Disposition: A | Discharge status: Home / Self Care | Payer: Medicaid Other | Source: Ambulatory Visit | Attending: Physician Assistant | Admitting: Physician Assistant

## 2023-11-24 DIAGNOSIS — R519 Headache, unspecified: Secondary | ICD-10-CM | POA: Insufficient documentation

## 2023-11-30 NOTE — Telephone Encounter (Signed)
Patient has an appt at [facility] on 2/18

## 2023-12-13 ENCOUNTER — Ambulatory Visit (INDEPENDENT_AMBULATORY_CARE_PROVIDER_SITE_OTHER): Payer: Medicaid Other | Admitting: Neurology

## 2023-12-13 ENCOUNTER — Ambulatory Visit (HOSPITAL_COMMUNITY)
Admission: RE | Admit: 2023-12-13 | Discharge: 2023-12-13 | Disposition: A | Discharge status: Home / Self Care | Payer: Medicaid Other | Source: Ambulatory Visit | Attending: Gynecologic Oncology | Admitting: Gynecologic Oncology

## 2023-12-13 ENCOUNTER — Encounter: Payer: Self-pay | Admitting: Neurology

## 2023-12-13 ENCOUNTER — Telehealth: Payer: Self-pay

## 2023-12-13 VITALS — BP 146/92 | HR 88 | Ht 61.0 in | Wt 283.0 lb

## 2023-12-13 DIAGNOSIS — G43709 Chronic migraine without aura, not intractable, without status migrainosus: Secondary | ICD-10-CM | POA: Diagnosis not present

## 2023-12-13 DIAGNOSIS — D3911 Neoplasm of uncertain behavior of right ovary: Secondary | ICD-10-CM | POA: Diagnosis present

## 2023-12-13 DIAGNOSIS — R5383 Other fatigue: Secondary | ICD-10-CM | POA: Insufficient documentation

## 2023-12-13 MED ORDER — ONDANSETRON 4 MG PO TBDP: 4.0000 mg | ORAL_TABLET | Freq: Three times a day (TID) | ORAL | 6 refills | Status: DC | PRN

## 2023-12-13 MED ORDER — SUMATRIPTAN SUCCINATE 100 MG PO TABS: 100.0000 mg | ORAL_TABLET | Freq: Once | ORAL | 6 refills | Status: DC | PRN

## 2023-12-13 MED ORDER — TOPIRAMATE 100 MG PO TABS: 100.0000 mg | ORAL_TABLET | Freq: Two times a day (BID) | ORAL | 6 refills | Status: DC

## 2023-12-13 MED ORDER — TIZANIDINE HCL 4 MG PO TABS: 4.0000 mg | ORAL_TABLET | Freq: Four times a day (QID) | ORAL | 6 refills | Status: DC | PRN

## 2023-12-13 NOTE — Progress Notes (Signed)
Chief Complaint  Patient presents with   New Patient (Initial Visit)    Pt in 15, here alone  Pt is referred by Wayland Denis NP for cerebellar tonsillar ectopia and headaches.       ASSESSMENT AND PLAN  Michele Wu is a 43 y.o. female   Chronic migraine headache Medication overuse headache  Patient has obesity, blurry nasal edge on funduscopic examination, narrow oropharyngeal space, at risk for intracranial hypertension and obstructive sleep apnea  Referred to ophthalmology  Refer to sleep study  Stop daily over-the-counter medication use,  Topamax titrating to 100 mg twice a day as preventive medication  Imitrex as needed, may combine with Zofran, tizanidine for severe prolonged headaches  Return To Clinic With NP In 6 Months   DIAGNOSTIC DATA (LABS, IMAGING, TESTING) - I reviewed patient records, labs, notes, testing and imaging myself where available.   MEDICAL HISTORY:  Michele Wu is a 43 year old female, seen in request by her primary care from [facility]  Wayland Denis for evaluation of chronic migraine headaches, initial evaluation was on December 13, 2023    History is obtained from the patient and review of electronic medical records. I personally reviewed pertinent available imaging films in PACS.   PMHx of  HTN HLD Obesity. Anxiety GERD  She had long history of chronic migraine, tends to start at the right retro-orbital area with light, noise sensitivity, nauseous getting worse over the past 5 years, more than 3-4 headache in a week, for that reason she has been taking daily over-the-counter medications ibuprofen, Aleve with limited help  Trigger for her headaches are stress, weather change, sleep deprivation, missing her meals  In addition she is concerned about slow weight gain, excessive daytime fatigue,  Had MRI of the brain from December 03, 2023 no acute intracranial abnormality, 6 mm cerebellar  tonsillar ectopia, no brainstem upper cervical crowding PHYSICAL EXAM:   Vitals:   12/13/23 1425 12/13/23 1438  BP: (!) 158/99 (!) 146/92  Pulse: 73 88  Weight: 283 lb (128.4 kg)   Height: 5\' 1"  (1.549 m)    Not recorded     Body mass index is 53.47 kg/m.  PHYSICAL EXAMNIATION:  Gen: NAD, conversant, well nourised, well groomed                     Cardiovascular: Regular rate rhythm, no peripheral edema, warm, nontender. Eyes: Conjunctivae clear without exudates or hemorrhage Neck: Supple, no carotid bruits. Pulmonary: Clear to auscultation bilaterally   NEUROLOGICAL EXAM:  MENTAL STATUS: Speech/cognition: Obese, awake, alert, oriented to history taking and casual conversation CRANIAL NERVES: CN II: Visual fields are full to confrontation. Pupils are round equal and briskly reactive to light.  Funduscopy examination showed blurry nasal edge bilaterally CN III, IV, VI: extraocular movement are normal. No ptosis. CN V: Facial sensation is intact to light touch CN VII: Face is symmetric with normal eye closure  CN VIII: Hearing is normal to causal conversation. CN IX, X: Phonation is normal. CN XI: Head turning and shoulder shrug are intact CN XII: Narrow oropharyngeal space  MOTOR: There is no pronator drift of out-stretched arms. Muscle bulk and tone are normal. Muscle strength is normal.  REFLEXES: Reflexes are 2+ and symmetric at the biceps, triceps, knees, and ankles. Plantar responses are flexor.  SENSORY: Intact to light touch, pinprick and vibratory sensation are intact in fingers and toes.  COORDINATION: There is no trunk or limb  dysmetria noted.  GAIT/STANCE: Posture is normal. Gait is steady with normal steps, base, arm swing, and turning. Heel and toe walking are normal. Tandem gait is normal.  Romberg is absent.  REVIEW OF SYSTEMS:  Full 14 system review of systems performed and notable only for as above All other review of systems were  negative.   ALLERGIES: Allergies  Allergen Reactions   Bactrim [Sulfamethoxazole-Trimethoprim] Nausea And Vomiting   Ciprofloxacin Nausea And Vomiting   Cymbalta [Duloxetine Hcl] Nausea Only   Augmentin [Amoxicillin-Pot Clavulanate] Rash   Macrobid [Nitrofurantoin Macrocrystal] Rash    HOME MEDICATIONS: Current Outpatient Medications  Medication Sig Dispense Refill   ALPRAZolam (XANAX) 1 MG tablet Take 1 mg by mouth 2 (two) times daily as needed for anxiety.     losartan-hydrochlorothiazide (HYZAAR) 100-12.5 MG tablet Take 1 tablet by mouth daily.     metoprolol succinate (TOPROL-XL) 50 MG 24 hr tablet Take 50 mg by mouth daily.     omeprazole (PRILOSEC) 20 MG capsule Take 20 mg by mouth daily.     simvastatin (ZOCOR) 40 MG tablet Take 40 mg by mouth at bedtime.     No current facility-administered medications for this visit.    PAST MEDICAL HISTORY: Past Medical History:  Diagnosis Date   Anxiety    Chest pain    Complication of anesthesia    Depression    Dysuria    GERD (gastroesophageal reflux disease)    High cholesterol    Hypertension    Low back pain    PONV (postoperative nausea and vomiting)     PAST SURGICAL HISTORY: Past Surgical History:  Procedure Laterality Date   ABDOMINAL HYSTERECTOMY N/A 04/22/2022   Procedure: HYSTERECTOMY ABDOMINAL;  Surgeon: Myna Hidalgo, DO;  Location: [facility];  Service: Gynecology;  Laterality: N/A;   BILATERAL SALPINGECTOMY N/A 04/22/2022   Procedure: OPEN BILATERAL SALPINGECTOMY;  Surgeon: Myna Hidalgo, DO;  Location: [facility];  Service: Gynecology;  Laterality: N/A;   CESAREAN SECTION     x2   CHOLECYSTECTOMY     OOPHORECTOMY Right 04/22/2022   Procedure: RIGHT OOPHORECTOMY AND RIGHT OVARIAN CYST REMOVAL;  Surgeon: Myna Hidalgo, DO;  Location: [facility];  Service: Gynecology;  Laterality: Right;    FAMILY HISTORY: Family History  Adopted: Yes  Family history unknown: Yes    SOCIAL HISTORY: Social History    Socioeconomic History   Marital status: Married    Spouse name: Not on file   Number of children: 2   Years of education: Not on file   Highest education level: Not on file  Occupational History   Occupation: home health care   Occupation: caregiver    Comment: [employer]  Tobacco Use   Smoking status: Never   Smokeless tobacco: Never  Vaping Use   Vaping status: Never Used  Substance and Sexual Activity   Alcohol use: Never   Drug use: Never   Sexual activity: Yes    Birth control/protection: Surgical    Comment: tubal/hyst  Other Topics Concern   Not on file  Social History Narrative   Not on file   Social Drivers of Health   Financial Resource Strain: Low Risk  (07/29/2023)   Overall Financial Resource Strain (CARDIA)    Difficulty of Paying Living Expenses: Not very hard  Food Insecurity: Food Insecurity Present (07/29/2023)   Hunger Vital Sign    Worried About Running Out of Food in the Last Year: Often true    Ran Out of  Food in the Last Year: Often true  Transportation Needs: No Transportation Needs (07/29/2023)   PRAPARE - Administrator, Civil Service (Medical): No    Lack of Transportation (Non-Medical): No  Physical Activity: Insufficiently Active (07/29/2023)   Exercise Vital Sign    Days of Exercise per Week: 5 days    Minutes of Exercise per Session: 20 min  Stress: Stress Concern Present (07/29/2023)   [employer] of Occupational Health - Occupational Stress Questionnaire    Feeling of Stress : Very much  Social Connections: Moderately Integrated (07/29/2023)   Social Connection and Isolation Panel [NHANES]    Frequency of Communication with Friends and Family: More than three times a week    Frequency of Social Gatherings with Friends and Family: Once a week    Attends Religious Services: More than 4 times per year    Active Member of [employer] or Organizations: No    Attends Banker Meetings: Never    Marital  Status: Married  Catering manager Violence: Not At Risk (07/29/2023)   Humiliation, Afraid, Rape, and Kick questionnaire    Fear of Current or Ex-Partner: No    Emotionally Abused: No    Physically Abused: No    Sexually Abused: No      Levert Feinstein, M.D. Ph.D.  [facility] [address] [place], [place] [address] Ph: [phone] Fax: [phone]  CC:  Sheela Stack [address] [place],  [place] [address]  Richardean Chimera, MD

## 2023-12-13 NOTE — Telephone Encounter (Signed)
Referral faxed to [facility] (P) [phone]  [phone]

## 2023-12-13 NOTE — Patient Instructions (Signed)
Meds ordered this encounter  Medications   topiramate (TOPAMAX) 100 MG tablet    Sig: Take 1 tablet (100 mg total) by mouth 2 (two) times daily.    Dispense:  60 tablet    Refill:  6  1 SUMAtriptan (IMITREX) 100 MG tablet    Sig: Take 1 tablet (100 mg total) by mouth once as needed for up to 1 dose for migraine. May repeat in 2 hours if headache persists or recurs.    Dispense:  12 tablet    Refill:  6  2 tiZANidine (ZANAFLEX) 4 MG tablet    Sig: Take 1 tablet (4 mg total) by mouth every 6 (six) hours as needed for muscle spasms.    Dispense:  30 tablet    Refill:  6  3 ondansetron (ZOFRAN-ODT) 4 MG disintegrating tablet    Sig: Take 1 tablet (4 mg total) by mouth every 8 (eight) hours as needed.    Dispense:  20 tablet    Refill:  6

## 2023-12-14 ENCOUNTER — Encounter: Payer: Self-pay | Admitting: Gynecologic Oncology

## 2023-12-17 ENCOUNTER — Inpatient Hospital Stay: Payer: Medicaid Other | Attending: Gynecologic Oncology | Admitting: Gynecologic Oncology

## 2023-12-17 VITALS — BP 132/88 | HR 73 | Temp 98.3°F | Resp 20 | Wt 279.6 lb

## 2023-12-17 DIAGNOSIS — D3911 Neoplasm of uncertain behavior of right ovary: Secondary | ICD-10-CM

## 2023-12-17 MED ORDER — HYDROCODONE-ACETAMINOPHEN 5-325 MG PO TABS: 1.0000 | ORAL_TABLET | ORAL | 0 refills | Status: DC | PRN

## 2023-12-17 MED ORDER — SENNOSIDES-DOCUSATE SODIUM 8.6-50 MG PO TABS: 2.0000 | ORAL_TABLET | Freq: Every day | ORAL | 0 refills | Status: DC

## 2023-12-17 NOTE — Patient Instructions (Addendum)
Preparing for your Surgery  Plan for surgery on January 05, 2024 with Dr. Eugene Garnet at [facility]. You will be scheduled for robotic assisted left oophorectomy, peritoneal biopsies, omental biopsy.   Pre-operative Testing -You will receive a phone call from presurgical testing at [facility] to arrange for a pre-operative appointment and lab work.  -Bring your insurance card, copy of an advanced directive if applicable, medication list  -At that visit, you will be asked to sign a consent for a possible blood transfusion in case a transfusion becomes necessary during surgery.  The need for a blood transfusion is rare but having consent is a necessary part of your care.     -You should not be taking blood thinners or aspirin at least ten days prior to surgery unless instructed by your surgeon.  -Do not take supplements such as fish oil (omega 3), red yeast rice, turmeric before your surgery. STOP TAKING AT LEAST 10 DAYS BEFORE SURGERY. You want to avoid medications with aspirin in them including headache powders such as BC or Goody's), Excedrin migraine.  Day Before Surgery at Home -You will be asked to take in a light diet the day before surgery. You will be advised you can have clear liquids up until 3 hours before your surgery.    Eat a light diet the day before surgery.  Examples including soups, broths, toast, yogurt, mashed potatoes.  AVOID GAS PRODUCING FOODS AND BEVERAGES. Things to avoid include carbonated beverages (fizzy beverages, sodas), raw fruits and raw vegetables (uncooked), or beans.   If your bowels are filled with gas, your surgeon will have difficulty visualizing your pelvic organs which increases your surgical risks.  Your role in recovery Your role is to become active as soon as directed by your doctor, while still giving yourself time to heal.  Rest when you feel tired. You will be asked to do the following in order to speed your recovery:  -  Cough and breathe deeply. This helps to clear and expand your lungs and can prevent pneumonia after surgery.  - STAY ACTIVE WHEN YOU GET HOME. Do mild physical activity. Walking or moving your legs help your circulation and body functions return to normal. Do not try to get up or walk alone the first time after surgery.   -If you develop swelling on one leg or the other, pain in the back of your leg, redness/warmth in one of your legs, please call the office or go to the Emergency Room to have a doppler to rule out a blood clot. For shortness of breath, chest pain-seek care in the Emergency Room as soon as possible. - Actively manage your pain. Managing your pain lets you move in comfort. We will ask you to rate your pain on a scale of zero to 10. It is your responsibility to tell your doctor or nurse where and how much you hurt so your pain can be treated.  Special Considerations -If you are diabetic, you may be placed on insulin after surgery to have closer control over your blood sugars to promote healing and recovery.  This does not mean that you will be discharged on insulin.  If applicable, your oral antidiabetics will be resumed when you are tolerating a solid diet.  -Your final pathology results from surgery should be available around one week after surgery and the results will be relayed to you when available.  -FMLA forms can be faxed to [phone] and please allow 5-7 business  days for completion.  Pain Management After Surgery -You will be prescribed your pain medication and bowel regimen medications before surgery so that you can have these available when you are discharged from the hospital. The pain medication is for use ONLY AFTER surgery and a new prescription will not be given.   -Make sure that you have Tylenol and Ibuprofen IF YOU ARE ABLE TO TAKE THESE MEDICATIONS at home to use on a regular basis after surgery for pain control. We recommend alternating the medications every  hour to six hours since they work differently and are processed in the body differently for pain relief.  -Review the attached handout on narcotic use and their risks and side effects.   Bowel Regimen -You will be prescribed Sennakot-S to take nightly to prevent constipation especially if you are taking the narcotic pain medication intermittently.  It is important to prevent constipation and drink adequate amounts of liquids. You can stop taking this medication when you are not taking pain medication and you are back on your normal bowel routine.  Risks of Surgery Risks of surgery are low but include bleeding, infection, damage to surrounding structures, re-operation, blood clots, and very rarely death.   Blood Transfusion Information (For the consent to be signed before surgery)  We will be checking your blood type before surgery so in case of emergencies, we will know what type of blood you would need.                                            WHAT IS A BLOOD TRANSFUSION?  A transfusion is the replacement of blood or some of its parts. Blood is made up of multiple cells which provide different functions. Red blood cells carry oxygen and are used for blood loss replacement. White blood cells fight against infection. Platelets control bleeding. Plasma helps clot blood. Other blood products are available for specialized needs, such as hemophilia or other clotting disorders. BEFORE THE TRANSFUSION  Who gives blood for transfusions?  You may be able to donate blood to be used at a later date on yourself (autologous donation). Relatives can be asked to donate blood. This is generally not any safer than if you have received blood from a stranger. The same precautions are taken to ensure safety when a relative's blood is donated. Healthy volunteers who are fully evaluated to make sure their blood is safe. This is blood bank blood. Transfusion therapy is the safest it has ever been in the  practice of medicine. Before blood is taken from a donor, a complete history is taken to make sure that person has no history of diseases nor engages in risky social behavior (examples are intravenous drug use or sexual activity with multiple partners). The donor's travel history is screened to minimize risk of transmitting infections, such as malaria. The donated blood is tested for signs of infectious diseases, such as HIV and hepatitis. The blood is then tested to be sure it is compatible with you in order to minimize the chance of a transfusion reaction. If you or a relative donates blood, this is often done in anticipation of surgery and is not appropriate for emergency situations. It takes many days to process the donated blood. RISKS AND COMPLICATIONS Although transfusion therapy is very safe and saves many lives, the main dangers of transfusion include:  Getting an infectious disease.  Developing a transfusion reaction. This is an allergic reaction to something in the blood you were given. Every precaution is taken to prevent this. The decision to have a blood transfusion has been considered carefully by your caregiver before blood is given. Blood is not given unless the benefits outweigh the risks.  AFTER SURGERY INSTRUCTIONS  Return to work: 4-6 weeks if applicable  Since you will be having your remaining ovary removed, Dr. Pricilla Holm will talk with you about hormone replacement after surgery.  Activity: 1. Be up and out of the bed during the day.  Take a nap if needed.  You may walk up steps but be careful and use the hand rail.  Stair climbing will tire you more than you think, you may need to stop part way and rest.   2. No lifting or straining for 6 weeks over 10 pounds. No pushing, pulling, straining for 6 weeks.  3. No driving for 1-61 days when the following criteria have been met: Do not drive if you are taking narcotic pain medicine and make sure that your reaction time has returned.    4. You can shower as soon as the next day after surgery. Shower daily.  Use your regular soap and water (not directly on the incision) and pat your incision(s) dry afterwards; don't rub.  No tub baths or submerging your body in water until cleared by your surgeon. If you have the soap that was given to you by pre-surgical testing that was used before surgery, you do not need to use it afterwards because this can irritate your incisions.   5. No sexual activity and nothing in the vagina for 4 weeks.  6. You may experience a small amount of clear drainage from your incisions, which is normal.  If the drainage persists, increases, or changes color please call the office.  7. Do not use creams, lotions, or ointments such as neosporin on your incisions after surgery until advised by your surgeon because they can cause removal of the dermabond glue on your incisions.    8. Take Tylenol or ibuprofen first for pain if you are able to take these medications and only use narcotic pain medication for severe pain not relieved by the Tylenol or Ibuprofen.  Monitor your Tylenol intake to a max of 4,000 mg in a 24 hour period. You can alternate these medications after surgery.  Diet: 1. Low sodium Heart Healthy Diet is recommended but you are cleared to resume your normal (before surgery) diet after your procedure.  2. It is safe to use a laxative, such as Miralax or Colace, if you have difficulty moving your bowels before surgery. You have been prescribed Sennakot-S to take at bedtime every evening after surgery to keep bowel movements regular and to prevent constipation.    Wound Care: 1. Keep clean and dry.  Shower daily.  Reasons to call the Doctor: Fever - Oral temperature greater than 100.4 degrees Fahrenheit Foul-smelling vaginal discharge Difficulty urinating Nausea and vomiting Increased pain at the site of the incision that is unrelieved with pain medicine. Difficulty breathing with or without  chest pain New calf pain especially if only on one side Sudden, continuing increased vaginal bleeding with or without clots.   Contacts: For questions or concerns you should contact:  Dr. Eugene Garnet at [phone]  Warner Mccreedy, NP at [phone]  After Hours: call [phone] and have the GYN Oncologist paged/contacted (after 5 pm or on the weekends). You will speak with an  after hours RN and let he or she know you have had surgery.  Messages sent via mychart are for non-urgent matters and are not responded to after hours so for urgent needs, please call the after hours number.

## 2023-12-17 NOTE — Progress Notes (Signed)
Patient here for follow up and for a pre-operative appointment prior to her scheduled surgery on 01/05/2024. She reports doing overall well since she was last seen. She continues to work and has recently seen a neurologist and been prescribed medications for daily headaches/migraines. On her ROS intake form, she is + for urinary frequency related to taking a diuretic and headaches. She is scheduled for robotic assisted left oophorectomy, peritoneal biopsies, omental biopsy. The surgery was discussed in detail.  See after visit summary for additional details.       Discussed post-op pain management in detail including the aspects of the enhanced recovery pathway.  Advised her that a new prescription would be sent in for hydrocodone/APAP and it is only to be used for after her upcoming surgery.  We discussed the use of tylenol post-op and to monitor for a maximum of 4,000 mg in a 24 hour period.  Also prescribed sennakot to be used after surgery and to hold if having loose stools.  Discussed bowel regimen in detail.     Discussed measures to take at home to prevent DVT including frequent mobility.  Reportable signs and symptoms of DVT discussed. Post-operative instructions discussed and expectations for after surgery. Incisional care discussed as well including reportable signs and symptoms including erythema, drainage, wound separation.     10 minutes spent preparing information and with the patient.  Verbalizing understanding of material discussed. No needs or concerns voiced at the end of the visit.   Advised patient to call for any needs.  Advised that her post-operative medications had been prescribed and could be picked up at any time.    This appointment is included in the global surgical bundle as pre-operative teaching and has no charge.

## 2023-12-22 ENCOUNTER — Other Ambulatory Visit: Payer: Self-pay | Admitting: Gynecologic Oncology

## 2023-12-22 DIAGNOSIS — D4959 Neoplasm of unspecified behavior of other genitourinary organ: Secondary | ICD-10-CM

## 2023-12-22 NOTE — Progress Notes (Signed)
 Sent message, via epic in basket, requesting orders in epic from Careers adviser.

## 2023-12-27 NOTE — Progress Notes (Signed)
 Anesthesia Review:  PCP: Cardiologist :  PPM/ ICD: Device Orders: Rep Notified:  Chest x-ray : EKG : Echo : 2018  Stress test: 2018  Cardiac Cath :   Activity level:  Sleep Study/ CPAP : Fasting Blood Sugar :      / Checks Blood Sugar -- times a day:    Blood Thinner/ Instructions /Last Dose: ASA / Instructions/ Last Dose :

## 2023-12-27 NOTE — Patient Instructions (Addendum)
 SURGICAL WAITING ROOM VISITATION  Patients having surgery or a procedure may have no more than 2 support people in the waiting area - these visitors may rotate.    Children under the age of 67 must have an adult with them who is not the patient.  Due to an increase in RSV and influenza rates and associated hospitalizations, children ages 47 and under may not visit patients in [facility] hospitals.  Visitors with respiratory illnesses are discouraged from visiting and should remain at home.  If the patient needs to stay at the hospital during part of their recovery, the visitor guidelines for inpatient rooms apply. Pre-op nurse will coordinate an appropriate time for 1 support person to accompany patient in pre-op.  This support person may not rotate.    Please refer to the [facility] website for the visitor guidelines for Inpatients (after your surgery is over and you are in a regular room).       Your procedure is scheduled on:  01/05/2024    Report to [facility] Main Entrance    Report to admitting at  200pm     Call this number if you have problems the morning of surgery [phone]   Do not eat food :After Midnight.             Eat a light diet the day before surgery.  Avoid gas producing foods.     After Midnight you may have the following liquids until _ 100pm  PM DAY OF SURGERY  Water Non-Citrus Juices (without pulp, NO RED-Apple, White grape, White cranberry) Black Coffee (NO MILK/CREAM OR CREAMERS, sugar ok)  Clear Tea (NO MILK/CREAM OR CREAMERS, sugar ok) regular and decaf                             Plain Jell-O (NO RED)                                           Fruit ices (not with fruit pulp, NO RED)                                     Popsicles (NO RED)                                                               Sports drinks like Gatorade (NO RED)                           If you have questions, please contact your surgeon's office.      Oral  Hygiene is also important to reduce your risk of infection.                                    Remember - BRUSH YOUR TEETH THE MORNING OF SURGERY WITH YOUR REGULAR TOOTHPASTE  DENTURES WILL BE REMOVED PRIOR TO SURGERY PLEASE DO NOT APPLY "Poly grip" OR ADHESIVES!!!  Do NOT smoke after Midnight   Stop all vitamins and herbal supplements 7 days before surgery.   Take these medicines the morning of surgery with A SIP OF WATER:   toprol, omeprazole, topamax   DO NOT TAKE ANY ORAL DIABETIC MEDICATIONS DAY OF YOUR SURGERY  Bring CPAP mask and tubing day of surgery.                              You may not have any metal on your body including hair pins, jewelry, and body piercing             Do not wear make-up, lotions, powders, perfumes/cologne, or deodorant  Do not wear nail polish including gel and S&S, artificial/acrylic nails, or any other type of covering on natural nails including finger and toenails. If you have artificial nails, gel coating, etc. that needs to be removed by a nail salon please have this removed prior to surgery or surgery may need to be canceled/ delayed if the surgeon/ anesthesia feels like they are unable to be safely monitored.   Do not shave  48 hours prior to surgery.               Men may shave face and neck.   Do not bring valuables to the hospital. [facility] IS NOT             RESPONSIBLE   FOR VALUABLES.   Contacts, glasses, dentures or bridgework may not be worn into surgery.   Bring small overnight bag day of surgery.   DO NOT BRING YOUR HOME MEDICATIONS TO THE HOSPITAL. PHARMACY WILL DISPENSE MEDICATIONS LISTED ON YOUR MEDICATION LIST TO YOU DURING YOUR ADMISSION IN THE HOSPITAL!    Patients discharged on the day of surgery will not be allowed to drive home.  Someone NEEDS to stay with you for the first 24 hours after anesthesia.   Special Instructions: Bring a copy of your healthcare power of attorney and living will documents the day of  surgery if you haven't scanned them before.              Please read over the following fact sheets you were given: IF YOU HAVE QUESTIONS ABOUT YOUR PRE-OP INSTRUCTIONS PLEASE CALL [phone]   If you received a COVID test during your pre-op visit  it is requested that you wear a mask when out in public, stay away from anyone that may not be feeling well and notify your surgeon if you develop symptoms. If you test positive for Covid or have been in contact with anyone that has tested positive in the last 10 days please notify you surgeon.    [facility] - Preparing for Surgery Before surgery, you can play an important role.  Because skin is not sterile, your skin needs to be as free of germs as possible.  You can reduce the number of germs on your skin by washing with CHG (chlorahexidine gluconate) soap before surgery.  CHG is an antiseptic cleaner which kills germs and bonds with the skin to continue killing germs even after washing. Please DO NOT use if you have an allergy to CHG or antibacterial soaps.  If your skin becomes reddened/irritated stop using the CHG and inform your nurse when you arrive at Short Stay. Do not shave (including legs and underarms) for at least 48 hours prior to the first CHG shower.  You may shave your face/neck.  Please follow these instructions carefully:  1.  Shower with CHG Soap the night before surgery and the  morning of Surgery.  2.  If you choose to wash your hair, wash your hair first as usual with your  normal  shampoo.  3.  After you shampoo, rinse your hair and body thoroughly to remove the  shampoo.                           4.  Use CHG as you would any other liquid soap.  You can apply chg directly  to the skin and wash                       Gently with a scrungie or clean washcloth.  5.  Apply the CHG Soap to your body ONLY FROM THE NECK DOWN.   Do not use on face/ open                           Wound or open sores. Avoid contact with eyes, ears mouth and  genitals (private parts).                       Wash face,  Genitals (private parts) with your normal soap.             6.  Wash thoroughly, paying special attention to the area where your surgery  will be performed.  7.  Thoroughly rinse your body with warm water from the neck down.  8.  DO NOT shower/wash with your normal soap after using and rinsing off  the CHG Soap.                9.  Pat yourself dry with a clean towel.            10.  Wear clean pajamas.            11.  Place clean sheets on your bed the night of your first shower and do not  sleep with pets. Day of Surgery : Do not apply any lotions/deodorants the morning of surgery.  Please wear clean clothes to the hospital/surgery center.  FAILURE TO FOLLOW THESE INSTRUCTIONS MAY RESULT IN THE CANCELLATION OF YOUR SURGERY PATIENT SIGNATURE_________________________________  NURSE SIGNATURE__________________________________  ________________________________________________________________________

## 2023-12-29 ENCOUNTER — Encounter (HOSPITAL_COMMUNITY): Payer: Self-pay

## 2023-12-29 ENCOUNTER — Other Ambulatory Visit: Payer: Self-pay

## 2023-12-29 ENCOUNTER — Encounter (HOSPITAL_COMMUNITY)
Admission: RE | Admit: 2023-12-29 | Discharge: 2023-12-29 | Disposition: A | Discharge status: Home / Self Care | Payer: Medicaid Other | Source: Ambulatory Visit | Attending: Anesthesiology | Admitting: Anesthesiology

## 2023-12-29 ENCOUNTER — Encounter (HOSPITAL_COMMUNITY): Payer: Self-pay | Admitting: Gynecologic Oncology

## 2023-12-29 ENCOUNTER — Telehealth: Payer: Self-pay

## 2023-12-29 ENCOUNTER — Encounter: Payer: Self-pay | Admitting: *Deleted

## 2023-12-29 DIAGNOSIS — Z01818 Encounter for other preprocedural examination: Secondary | ICD-10-CM

## 2023-12-29 NOTE — Telephone Encounter (Signed)
 scheduled for robotic assisted left oophorectomy, peritoneal biopsies, omental biopsy on March 12 with Dr.Tucker  Michele Wu called stating she had to change her pre-admission testing to a phone visit today stating she wasn't feeling good. She went to the Dr's and she has a sinus infection and is on  an abx. She has no fever/chills just feels achy. COVID /FLU tests were negative. Pre-admit told her to call Dr.Tucker and give a heads up.

## 2023-12-30 ENCOUNTER — Other Ambulatory Visit (HOSPITAL_COMMUNITY): Payer: Medicaid Other

## 2023-12-30 ENCOUNTER — Telehealth: Payer: Self-pay

## 2023-12-30 ENCOUNTER — Telehealth: Payer: Self-pay | Admitting: *Deleted

## 2023-12-30 NOTE — Telephone Encounter (Signed)
-----   Message from Doylene Bode sent at 12/30/2023  3:57 PM EST ----- Received this from anesthesia:  "The anesthesia guidelines are for patients to be at least 2 weeks asymptomatic after upper resp infections."  Please call and find out when her symptoms resolved. We may have to move her surgery based on the above.

## 2023-12-30 NOTE — Telephone Encounter (Signed)
 Spoke with Michele Wu who states she saw her PCP for a sinus infection and they prescribed doxycline. Pt states she is feeling better and denies sinus congestion, fever and or pain. Pt denies cough and chest congestion. Pt reminded that the office will call on Tuesday, March 11 th for her pre-op call.

## 2023-12-30 NOTE — Telephone Encounter (Signed)
 Per Warner Mccreedy NP, I reached out to Michele Wu regarding anesthesia guidelines. She states she feels a lot better than she did last week when she was seen at her PCP's office. However, she still has a dry cough and may have wheezing from time to time. She voices an understanding of the safety behind the guidelines and states she is ok with pushing out her surgery.   Aware we will call her with a new date.  Warner Mccreedy NP notified.

## 2023-12-30 NOTE — Telephone Encounter (Signed)
 Attempted to reach patient yesterday and this morning in regards to her symptoms and surgery date. Unable to leave a voicemail due to mailbox being full and home number call as well.  MyChart message was left for patient to call the office.

## 2023-12-31 ENCOUNTER — Telehealth: Payer: Self-pay

## 2023-12-31 NOTE — Telephone Encounter (Signed)
-----   Message from Doylene Bode sent at 12/31/2023 10:22 AM EST ----- Please let the patient know she has been rescheduled to April 9 at this time. There could be more openings sooner and we will contact her with those dates to see about moving up if dates become available.

## 2023-12-31 NOTE — Telephone Encounter (Signed)
 Per Warner Mccreedy Np, Pt is aware of new surgery date being 4/9. She will receive a call from Pre-admit testing for a new appointment.  Pt agreed to new date

## 2024-01-03 ENCOUNTER — Encounter (HOSPITAL_COMMUNITY)

## 2024-01-04 ENCOUNTER — Institutional Professional Consult (permissible substitution): Payer: Medicaid Other | Admitting: Neurology

## 2024-01-04 ENCOUNTER — Encounter: Payer: Self-pay | Admitting: Neurology

## 2024-01-04 ENCOUNTER — Telehealth: Payer: Self-pay | Admitting: *Deleted

## 2024-01-04 NOTE — Telephone Encounter (Signed)
 Patient no showed to her appt on 3/7 for [facility]. Appt r/s to 4/2

## 2024-01-05 DIAGNOSIS — D4959 Neoplasm of unspecified behavior of other genitourinary organ: Secondary | ICD-10-CM

## 2024-01-18 ENCOUNTER — Telehealth: Payer: Self-pay | Admitting: *Deleted

## 2024-01-18 NOTE — Telephone Encounter (Signed)
 Spoke with the patient and moved appt from 5/2 to 5/9

## 2024-01-19 ENCOUNTER — Institutional Professional Consult (permissible substitution): Admitting: Neurology

## 2024-01-25 NOTE — Patient Instructions (Signed)
 DUE TO COVID-19 ONLY TWO VISITORS  (aged 45 and older)  ARE ALLOWED TO COME WITH YOU AND STAY IN THE WAITING ROOM ONLY DURING PRE OP AND PROCEDURE.   **NO VISITORS ARE ALLOWED IN THE SHORT STAY AREA OR RECOVERY ROOM!!**  IF YOU WILL BE ADMITTED INTO THE HOSPITAL YOU ARE ALLOWED ONLY FOUR SUPPORT PEOPLE DURING VISITATION HOURS ONLY (7 AM -8PM)   The support person(s) must pass our screening, gel in and out, and wear a mask at all times, including in the patient's room. Patients must also wear a mask when staff or their support person are in the room. Visitors GUEST BADGE MUST BE WORN VISIBLY  One adult visitor may remain with you overnight and MUST be in the room by 8 P.M.     Your procedure is scheduled on: 02/02/24   Report to [facility] Main Entrance    Report to admitting at : 7:45 AM   Call this number if you have problems the morning of surgery [phone]  Eat a light diet the day before surgery.  Examples including soups, broths, toast, yogurt, mashed potatoes.  Things to avoid include carbonated beverages (fizzy beverages), raw fruits and raw vegetables, or beans.   If your bowels are filled with gas, your surgeon will have difficulty visualizing your pelvic organs which increases your surgical risks.   Do not eat food :After Midnight.   After Midnight you may have the following liquids until : 7:00 AM DAY OF SURGERY  Water Black Coffee (sugar ok, NO MILK/CREAM OR CREAMERS)  Tea (sugar ok, NO MILK/CREAM OR CREAMERS) regular and decaf                             Plain Jell-O (NO RED)                                           Fruit ices (not with fruit pulp, NO RED)                                     Popsicles (NO RED)                                                                  Juice: apple, WHITE grape, WHITE cranberry Sports drinks like Gatorade (NO RED)              FOLLOW ANY ADDITIONAL PRE OP INSTRUCTIONS YOU RECEIVED FROM YOUR SURGEON'S OFFICE!!!   Oral  Hygiene is also important to reduce your risk of infection.                                    Remember - BRUSH YOUR TEETH THE MORNING OF SURGERY WITH YOUR REGULAR TOOTHPASTE  DENTURES WILL BE REMOVED PRIOR TO SURGERY PLEASE DO NOT APPLY "Poly grip" OR ADHESIVES!!!   Do NOT smoke after Midnight   Take these medicines the morning of surgery with A SIP OF  WATER: metoprolol,omeprazole.           STOP TAKING all Vitamins, Herbs and supplements 1 week before your surgery.                      You may not have any metal on your body including hair pins, jewelry, and body piercing             Do not wear make-up, lotions, powders, perfumes/cologne, or deodorant  Do not wear nail polish including gel and S&S, artificial/acrylic nails, or any other type of covering on natural nails including finger and toenails. If you have artificial nails, gel coating, etc. that needs to be removed by a nail salon please have this removed prior to surgery or surgery may need to be canceled/ delayed if the surgeon/ anesthesia feels like they are unable to be safely monitored.   Do not shave  48 hours prior to surgery.    Do not bring valuables to the hospital. [facility] IS NOT             RESPONSIBLE   FOR VALUABLES.   Contacts, glasses, or bridgework may not be worn into surgery.   Bring small overnight bag day of surgery.   DO NOT BRING YOUR HOME MEDICATIONS TO THE HOSPITAL. PHARMACY WILL DISPENSE MEDICATIONS LISTED ON YOUR MEDICATION LIST TO YOU DURING YOUR ADMISSION IN THE HOSPITAL!    Patients discharged on the day of surgery will not be allowed to drive home.  Someone NEEDS to stay with you for the first 24 hours after anesthesia.   Special Instructions: Bring a copy of your healthcare power of attorney and living will documents         the day of surgery if you haven't scanned them before.              Please read over the following fact sheets you were given: IF YOU HAVE QUESTIONS ABOUT YOUR PRE-OP  INSTRUCTIONS PLEASE CALL [phone]    [facility] - Preparing for Surgery Before surgery, you can play an important role.  Because skin is not sterile, your skin needs to be as free of germs as possible.  You can reduce the number of germs on your skin by washing with CHG (chlorahexidine gluconate) soap before surgery.  CHG is an antiseptic cleaner which kills germs and bonds with the skin to continue killing germs even after washing. Please DO NOT use if you have an allergy to CHG or antibacterial soaps.  If your skin becomes reddened/irritated stop using the CHG and inform your nurse when you arrive at Short Stay. Do not shave (including legs and underarms) for at least 48 hours prior to the first CHG shower.  You may shave your face/neck. Please follow these instructions carefully:  1.  Shower with CHG Soap the night before surgery and the  morning of Surgery.  2.  If you choose to wash your hair, wash your hair first as usual with your  normal  shampoo.  3.  After you shampoo, rinse your hair and body thoroughly to remove the  shampoo.                           4.  Use CHG as you would any other liquid soap.  You can apply chg directly  to the skin and wash  Gently with a scrungie or clean washcloth.  5.  Apply the CHG Soap to your body ONLY FROM THE NECK DOWN.   Do not use on face/ open                           Wound or open sores. Avoid contact with eyes, ears mouth and genitals (private parts).                       Wash face,  Genitals (private parts) with your normal soap.             6.  Wash thoroughly, paying special attention to the area where your surgery  will be performed.  7.  Thoroughly rinse your body with warm water from the neck down.  8.  DO NOT shower/wash with your normal soap after using and rinsing off  the CHG Soap.                9.  Pat yourself dry with a clean towel.            10.  Wear clean pajamas.            11.  Place clean sheets on  your bed the night of your first shower and do not  sleep with pets. Day of Surgery : Do not apply any lotions/deodorants the morning of surgery.  Please wear clean clothes to the hospital/surgery center.  FAILURE TO FOLLOW THESE INSTRUCTIONS MAY RESULT IN THE CANCELLATION OF YOUR SURGERY PATIENT SIGNATURE_________________________________  NURSE SIGNATURE__________________________________  ________________________________________________________________________ WHAT IS A BLOOD TRANSFUSION? Blood Transfusion Information  A transfusion is the replacement of blood or some of its parts. Blood is made up of multiple cells which provide different functions. Red blood cells carry oxygen and are used for blood loss replacement. White blood cells fight against infection. Platelets control bleeding. Plasma helps clot blood. Other blood products are available for specialized needs, such as hemophilia or other clotting disorders. BEFORE THE TRANSFUSION  Who gives blood for transfusions?  Healthy volunteers who are fully evaluated to make sure their blood is safe. This is blood bank blood. Transfusion therapy is the safest it has ever been in the practice of medicine. Before blood is taken from a donor, a complete history is taken to make sure that person has no history of diseases nor engages in risky social behavior (examples are intravenous drug use or sexual activity with multiple partners). The donor's travel history is screened to minimize risk of transmitting infections, such as malaria. The donated blood is tested for signs of infectious diseases, such as HIV and hepatitis. The blood is then tested to be sure it is compatible with you in order to minimize the chance of a transfusion reaction. If you or a relative donates blood, this is often done in anticipation of surgery and is not appropriate for emergency situations. It takes many days to process the donated blood. RISKS AND  COMPLICATIONS Although transfusion therapy is very safe and saves many lives, the main dangers of transfusion include:  Getting an infectious disease. Developing a transfusion reaction. This is an allergic reaction to something in the blood you were given. Every precaution is taken to prevent this. The decision to have a blood transfusion has been considered carefully by your caregiver before blood is given. Blood is not given unless the benefits outweigh the risks. AFTER THE TRANSFUSION Right after receiving  a blood transfusion, you will usually feel much better and more energetic. This is especially true if your red blood cells have gotten low (anemic). The transfusion raises the level of the red blood cells which carry oxygen, and this usually causes an energy increase. The nurse administering the transfusion will monitor you carefully for complications. HOME CARE INSTRUCTIONS  No special instructions are needed after a transfusion. You may find your energy is better. Speak with your caregiver about any limitations on activity for underlying diseases you may have. SEEK MEDICAL CARE IF:  Your condition is not improving after your transfusion. You develop redness or irritation at the intravenous (IV) site. SEEK IMMEDIATE MEDICAL CARE IF:  Any of the following symptoms occur over the next 12 hours: Shaking chills. You have a temperature by mouth above 102 F (38.9 C), not controlled by medicine. Chest, back, or muscle pain. People around you feel you are not acting correctly or are confused. Shortness of breath or difficulty breathing. Dizziness and fainting. You get a rash or develop hives. You have a decrease in urine output. Your urine turns a dark color or changes to pink, red, or brown. Any of the following symptoms occur over the next 10 days: You have a temperature by mouth above 102 F (38.9 C), not controlled by medicine. Shortness of breath. Weakness after normal activity. The  white part of the eye turns yellow (jaundice). You have a decrease in the amount of urine or are urinating less often. Your urine turns a dark color or changes to pink, red, or brown. Document Released: 10/09/2000 Document Revised: 01/04/2012 Document Reviewed: 05/28/2008 [facility] Patient Information 2014 [place], [place].  _______________________________________________________________________

## 2024-01-26 ENCOUNTER — Encounter (HOSPITAL_COMMUNITY): Payer: Self-pay

## 2024-01-26 ENCOUNTER — Other Ambulatory Visit: Payer: Self-pay

## 2024-01-26 ENCOUNTER — Encounter (HOSPITAL_COMMUNITY)
Admission: RE | Admit: 2024-01-26 | Discharge: 2024-01-26 | Disposition: A | Discharge status: Home / Self Care | Source: Ambulatory Visit | Attending: Gynecologic Oncology | Admitting: Gynecologic Oncology

## 2024-01-26 DIAGNOSIS — R9431 Abnormal electrocardiogram [ECG] [EKG]: Secondary | ICD-10-CM | POA: Insufficient documentation

## 2024-01-26 DIAGNOSIS — Z01810 Encounter for preprocedural cardiovascular examination: Secondary | ICD-10-CM | POA: Diagnosis present

## 2024-01-26 DIAGNOSIS — D4959 Neoplasm of unspecified behavior of other genitourinary organ: Secondary | ICD-10-CM | POA: Diagnosis not present

## 2024-01-26 DIAGNOSIS — Z01812 Encounter for preprocedural laboratory examination: Secondary | ICD-10-CM | POA: Diagnosis present

## 2024-01-26 DIAGNOSIS — Z01818 Encounter for other preprocedural examination: Secondary | ICD-10-CM | POA: Insufficient documentation

## 2024-01-26 LAB — COMPREHENSIVE METABOLIC PANEL WITH GFR
ALT: 40 U/L (ref 0–44)
AST: 41 U/L (ref 15–41)
Albumin: 3.9 g/dL (ref 3.5–5.0)
Alkaline Phosphatase: 67 U/L (ref 38–126)
Anion gap: 9 (ref 5–15)
BUN: 17 mg/dL (ref 6–20)
CO2: 24 mmol/L (ref 22–32)
Calcium: 9.1 mg/dL (ref 8.9–10.3)
Chloride: 105 mmol/L (ref 98–111)
Creatinine, Ser: 0.72 mg/dL (ref 0.44–1.00)
GFR, Estimated: >60 mL/min (ref >60)
Glucose, Bld: 104 mg/dL — ABNORMAL HIGH (ref 70–99)
Potassium: 3.5 mmol/L (ref 3.5–5.1)
Sodium: 138 mmol/L (ref 135–145)
Total Bilirubin: 0.7 mg/dL (ref 0.0–1.2)
Total Protein: 7.5 g/dL (ref 6.5–8.1)

## 2024-01-26 LAB — CBC
HCT: 39.5 % (ref 36.0–46.0)
Hemoglobin: 13.5 g/dL (ref 12.0–15.0)
MCH: 31.9 pg (ref 26.0–34.0)
MCHC: 34.2 g/dL (ref 30.0–36.0)
MCV: 93.4 fL (ref 80.0–100.0)
Platelets: 264 10*3/uL (ref 150–400)
RBC: 4.23 MIL/uL (ref 3.87–5.11)
RDW: 13.5 % (ref 11.5–15.5)
WBC: 6.5 10*3/uL (ref 4.0–10.5)
nRBC: 0 % (ref 0.0–0.2)

## 2024-01-26 NOTE — Progress Notes (Addendum)
 For Anesthesia: PCP - Richardean Chimera, MD  Cardiologist - N/A  Bowel Prep reminder:  Chest x-ray -  EKG - 01/26/24 Stress Test - 2018 ECHO - 2018 Cardiac Cath -  Pacemaker/ICD device last checked: Pacemaker orders received: Device Rep notified:  Spinal Cord Stimulator:N/A  Sleep Study - N/A CPAP -   Fasting Blood Sugar - N/A Checks Blood Sugar _____ times a day Date and result of last Hgb A1c-  Last dose of GLP1 agonist- N/A GLP1 instructions:   Last dose of SGLT-2 inhibitors- N/A SGLT-2 instructions:   Blood Thinner Instructions:N/A Aspirin Instructions: Last Dose:  Activity level: Can go up a flight of stairs and activities of daily living without stopping and without chest pain and/or shortness of breath   Able to exercise without chest pain and/or shortness of breath  Anesthesia review: Hx: HTN. Pt. Blood pressure was elevated: 144/115, pt. Is aware that if BP is too hight,the case can be cancelled.As per pt. Her BP was normal yesterday at PCP office.  Patient denies shortness of breath, fever, cough and chest pain at PAT appointment   Patient verbalized understanding of instructions that were given to them at the PAT appointment. Patient was also instructed that they will need to review over the PAT instructions again at home before surgery.

## 2024-01-28 ENCOUNTER — Encounter: Payer: Medicaid Other | Admitting: Gynecologic Oncology

## 2024-02-01 ENCOUNTER — Telehealth: Payer: Self-pay | Admitting: *Deleted

## 2024-02-01 NOTE — Telephone Encounter (Signed)
Telephone call to check on pre-operative status.  Patient compliant with pre-operative instructions.  Reinforced nothing to eat after midnight. Clear liquids until 0645. Patient to arrive at 0745.  No questions or concerns voiced.  Instructed to call for any needs.

## 2024-02-02 ENCOUNTER — Ambulatory Visit (HOSPITAL_COMMUNITY)
Admission: RE | Admit: 2024-02-02 | Discharge: 2024-02-02 | Disposition: A | Discharge status: Home / Self Care | Payer: Medicaid Other | Attending: Gynecologic Oncology | Admitting: Gynecologic Oncology

## 2024-02-02 ENCOUNTER — Ambulatory Visit (HOSPITAL_COMMUNITY): Payer: Self-pay | Admitting: Physician Assistant

## 2024-02-02 ENCOUNTER — Encounter (HOSPITAL_COMMUNITY)
Admission: RE | Disposition: A | Discharge status: Home / Self Care | Payer: Self-pay | Source: Home / Self Care | Attending: Gynecologic Oncology

## 2024-02-02 ENCOUNTER — Other Ambulatory Visit: Payer: Self-pay

## 2024-02-02 ENCOUNTER — Ambulatory Visit (HOSPITAL_BASED_OUTPATIENT_CLINIC_OR_DEPARTMENT_OTHER): Payer: Self-pay | Admitting: Certified Registered"

## 2024-02-02 ENCOUNTER — Encounter (HOSPITAL_COMMUNITY): Payer: Self-pay | Admitting: Gynecologic Oncology

## 2024-02-02 DIAGNOSIS — N736 Female pelvic peritoneal adhesions (postinfective): Secondary | ICD-10-CM | POA: Diagnosis not present

## 2024-02-02 DIAGNOSIS — I10 Essential (primary) hypertension: Secondary | ICD-10-CM | POA: Diagnosis not present

## 2024-02-02 DIAGNOSIS — F418 Other specified anxiety disorders: Secondary | ICD-10-CM

## 2024-02-02 DIAGNOSIS — E669 Obesity, unspecified: Secondary | ICD-10-CM | POA: Diagnosis not present

## 2024-02-02 DIAGNOSIS — N80C19 Endometriosis of the anterior abdominal wall, unspecified depth: Secondary | ICD-10-CM | POA: Insufficient documentation

## 2024-02-02 DIAGNOSIS — E6689 Other obesity not elsewhere classified: Secondary | ICD-10-CM | POA: Insufficient documentation

## 2024-02-02 DIAGNOSIS — N8302 Follicular cyst of left ovary: Secondary | ICD-10-CM | POA: Diagnosis not present

## 2024-02-02 DIAGNOSIS — K219 Gastro-esophageal reflux disease without esophagitis: Secondary | ICD-10-CM | POA: Diagnosis not present

## 2024-02-02 DIAGNOSIS — K660 Peritoneal adhesions (postprocedural) (postinfection): Secondary | ICD-10-CM | POA: Diagnosis not present

## 2024-02-02 DIAGNOSIS — D4959 Neoplasm of unspecified behavior of other genitourinary organ: Secondary | ICD-10-CM

## 2024-02-02 DIAGNOSIS — N838 Other noninflammatory disorders of ovary, fallopian tube and broad ligament: Secondary | ICD-10-CM | POA: Diagnosis present

## 2024-02-02 DIAGNOSIS — D271 Benign neoplasm of left ovary: Secondary | ICD-10-CM | POA: Diagnosis not present

## 2024-02-02 DIAGNOSIS — Z6841 Body Mass Index (BMI) 40.0 and over, adult: Secondary | ICD-10-CM | POA: Insufficient documentation

## 2024-02-02 DIAGNOSIS — D3911 Neoplasm of uncertain behavior of right ovary: Secondary | ICD-10-CM

## 2024-02-02 HISTORY — PX: XI ROBOTIC ASSISTED OOPHORECTOMY: SHX6823

## 2024-02-02 HISTORY — DX: Headache, unspecified: R51.9

## 2024-02-02 LAB — TYPE AND SCREEN
ABO/RH(D): O POS
Antibody Screen: NEGATIVE

## 2024-02-02 SURGERY — OOPHORECTOMY, ROBOT-ASSISTED
Anesthesia: General | Laterality: Left

## 2024-02-02 MED ORDER — FENTANYL CITRATE PF 50 MCG/ML IJ SOSY
PREFILLED_SYRINGE | INTRAMUSCULAR | Status: AC
  Filled 2024-02-02: qty 1

## 2024-02-02 MED ORDER — ACETAMINOPHEN 10 MG/ML IV SOLN: 1000.0000 mg | Freq: Once | INTRAVENOUS | Status: DC | PRN

## 2024-02-02 MED ORDER — PROPOFOL 10 MG/ML IV BOLUS
INTRAVENOUS | Status: DC | PRN
  Administered 2024-02-02: 150 mg via INTRAVENOUS

## 2024-02-02 MED ORDER — SUGAMMADEX SODIUM 200 MG/2ML IV SOLN
INTRAVENOUS | Status: DC | PRN
  Administered 2024-02-02: 250 mg via INTRAVENOUS

## 2024-02-02 MED ORDER — ROCURONIUM BROMIDE 10 MG/ML (PF) SYRINGE
PREFILLED_SYRINGE | INTRAVENOUS | Status: AC
  Filled 2024-02-02: qty 10

## 2024-02-02 MED ORDER — DEXMEDETOMIDINE HCL IN NACL 80 MCG/20ML IV SOLN
INTRAVENOUS | Status: AC
  Filled 2024-02-02: qty 20

## 2024-02-02 MED ORDER — ONDANSETRON HCL 4 MG/2ML IJ SOLN
INTRAMUSCULAR | Status: DC | PRN
  Administered 2024-02-02: 4 mg via INTRAVENOUS

## 2024-02-02 MED ORDER — STERILE WATER FOR IRRIGATION IR SOLN
Status: DC | PRN
  Administered 2024-02-02: 500 mL

## 2024-02-02 MED ORDER — DEXMEDETOMIDINE HCL IN NACL 80 MCG/20ML IV SOLN
INTRAVENOUS | Status: DC | PRN
  Administered 2024-02-02 (×2): 4 ug via INTRAVENOUS

## 2024-02-02 MED ORDER — FENTANYL CITRATE (PF) 100 MCG/2ML IJ SOLN
INTRAMUSCULAR | Status: AC
  Filled 2024-02-02: qty 2

## 2024-02-02 MED ORDER — MIDAZOLAM HCL 2 MG/2ML IJ SOLN
INTRAMUSCULAR | Status: DC | PRN
  Administered 2024-02-02: 2 mg via INTRAVENOUS

## 2024-02-02 MED ORDER — HEPARIN SODIUM (PORCINE) 5000 UNIT/ML IJ SOLN
5000.0000 [IU] | INTRAMUSCULAR | Status: AC
Start: 2024-02-02 — End: 2024-02-02
  Administered 2024-02-02: 5000 [IU] via SUBCUTANEOUS
  Filled 2024-02-02: qty 1

## 2024-02-02 MED ORDER — OXYCODONE HCL 5 MG PO TABS
ORAL_TABLET | ORAL | Status: AC
  Administered 2024-02-02: 5 mg via ORAL
  Filled 2024-02-02: qty 1

## 2024-02-02 MED ORDER — CHLORHEXIDINE GLUCONATE 0.12 % MT SOLN
15.0000 mL | Freq: Once | OROMUCOSAL | Status: AC
  Administered 2024-02-02: 15 mL via OROMUCOSAL

## 2024-02-02 MED ORDER — EPHEDRINE 5 MG/ML INJ
INTRAVENOUS | Status: AC
  Filled 2024-02-02: qty 5

## 2024-02-02 MED ORDER — DROPERIDOL 2.5 MG/ML IJ SOLN
0.6250 mg | Freq: Once | INTRAMUSCULAR | Status: AC | PRN
  Administered 2024-02-02: 0.625 mg via INTRAVENOUS

## 2024-02-02 MED ORDER — BUPIVACAINE HCL (PF) 0.25 % IJ SOLN
INTRAMUSCULAR | Status: DC | PRN
  Administered 2024-02-02: 30 mL

## 2024-02-02 MED ORDER — ONDANSETRON HCL 4 MG/2ML IJ SOLN
INTRAMUSCULAR | Status: AC
Start: 2024-02-02 — End: ?
  Filled 2024-02-02: qty 2

## 2024-02-02 MED ORDER — OXYCODONE HCL 5 MG/5ML PO SOLN: 5.0000 mg | Freq: Once | ORAL | Status: AC | PRN

## 2024-02-02 MED ORDER — LACTATED RINGERS IV SOLN: INTRAVENOUS | Status: DC

## 2024-02-02 MED ORDER — FENTANYL CITRATE PF 50 MCG/ML IJ SOSY
PREFILLED_SYRINGE | INTRAMUSCULAR | Status: AC
  Administered 2024-02-02: 50 ug via INTRAVENOUS
  Filled 2024-02-02: qty 2

## 2024-02-02 MED ORDER — LIDOCAINE HCL (CARDIAC) PF 100 MG/5ML IV SOSY
PREFILLED_SYRINGE | INTRAVENOUS | Status: DC | PRN
  Administered 2024-02-02: 60 mg via INTRAVENOUS

## 2024-02-02 MED ORDER — DEXAMETHASONE SODIUM PHOSPHATE 10 MG/ML IJ SOLN
INTRAMUSCULAR | Status: AC
  Filled 2024-02-02: qty 1

## 2024-02-02 MED ORDER — LACTATED RINGERS IR SOLN
Status: DC | PRN
  Administered 2024-02-02: 1000 mL

## 2024-02-02 MED ORDER — FENTANYL CITRATE PF 50 MCG/ML IJ SOSY
25.0000 ug | PREFILLED_SYRINGE | INTRAMUSCULAR | Status: DC | PRN
  Administered 2024-02-02: 50 ug via INTRAVENOUS

## 2024-02-02 MED ORDER — MIDAZOLAM HCL 2 MG/2ML IJ SOLN
INTRAMUSCULAR | Status: AC
  Filled 2024-02-02: qty 2

## 2024-02-02 MED ORDER — BUPIVACAINE HCL (PF) 0.25 % IJ SOLN
INTRAMUSCULAR | Status: AC
  Filled 2024-02-02: qty 30

## 2024-02-02 MED ORDER — ROCURONIUM BROMIDE 10 MG/ML (PF) SYRINGE
PREFILLED_SYRINGE | INTRAVENOUS | Status: DC | PRN
  Administered 2024-02-02: 10 mg via INTRAVENOUS
  Administered 2024-02-02: 5 mg via INTRAVENOUS
  Administered 2024-02-02: 10 mg via INTRAVENOUS
  Administered 2024-02-02: 60 mg via INTRAVENOUS

## 2024-02-02 MED ORDER — ACETAMINOPHEN 500 MG PO TABS
1000.0000 mg | ORAL_TABLET | ORAL | Status: AC
  Administered 2024-02-02: 1000 mg via ORAL
  Filled 2024-02-02: qty 2

## 2024-02-02 MED ORDER — SCOPOLAMINE 1 MG/3DAYS TD PT72
1.0000 | MEDICATED_PATCH | TRANSDERMAL | Status: DC
Start: 2024-02-02 — End: 2024-02-02
  Administered 2024-02-02: 1.5 mg via TRANSDERMAL
  Filled 2024-02-02: qty 1

## 2024-02-02 MED ORDER — OXYCODONE HCL 5 MG PO TABS: 5.0000 mg | ORAL_TABLET | Freq: Once | ORAL | Status: AC | PRN

## 2024-02-02 MED ORDER — DEXAMETHASONE SODIUM PHOSPHATE 10 MG/ML IJ SOLN
INTRAMUSCULAR | Status: DC | PRN
  Administered 2024-02-02: 4 mg via INTRAVENOUS

## 2024-02-02 MED ORDER — PROPOFOL 10 MG/ML IV BOLUS
INTRAVENOUS | Status: AC
  Filled 2024-02-02: qty 20

## 2024-02-02 MED ORDER — ORAL CARE MOUTH RINSE: 15.0000 mL | Freq: Once | OROMUCOSAL | Status: AC

## 2024-02-02 MED ORDER — FENTANYL CITRATE (PF) 100 MCG/2ML IJ SOLN
INTRAMUSCULAR | Status: DC | PRN
Start: 2024-02-02 — End: 2024-02-02
  Administered 2024-02-02: 100 ug via INTRAVENOUS

## 2024-02-02 MED ORDER — EPHEDRINE SULFATE-NACL 50-0.9 MG/10ML-% IV SOSY
PREFILLED_SYRINGE | INTRAVENOUS | Status: DC | PRN
  Administered 2024-02-02 (×2): 5 mg via INTRAVENOUS

## 2024-02-02 MED ORDER — DEXAMETHASONE SODIUM PHOSPHATE 4 MG/ML IJ SOLN: 4.0000 mg | INTRAMUSCULAR | Status: DC

## 2024-02-02 MED ORDER — DROPERIDOL 2.5 MG/ML IJ SOLN
INTRAMUSCULAR | Status: AC
  Filled 2024-02-02: qty 2

## 2024-02-02 MED ORDER — GABAPENTIN 300 MG PO CAPS
300.0000 mg | ORAL_CAPSULE | ORAL | Status: AC
  Administered 2024-02-02: 300 mg via ORAL
  Filled 2024-02-02: qty 1

## 2024-02-02 MED ORDER — KETAMINE HCL 50 MG/5ML IJ SOSY
PREFILLED_SYRINGE | INTRAMUSCULAR | Status: AC
  Filled 2024-02-02: qty 5

## 2024-02-02 SURGICAL SUPPLY — 75 items
APPLICATOR ARISTA FLEXITIP XL (MISCELLANEOUS) IMPLANT
APPLICATOR SURGIFLO ENDO (HEMOSTASIS) IMPLANT
BAG COUNTER SPONGE SURGICOUNT (BAG) IMPLANT
BAG LAPAROSCOPIC 12 15 PORT 16 (BASKET) IMPLANT
BAG RETRIEVAL 12/15 (BASKET) IMPLANT
BLADE SURG SZ10 CARB STEEL (BLADE) IMPLANT
COVER BACK TABLE 60X90IN (DRAPES) ×1 IMPLANT
COVER TIP SHEARS 8 DVNC (MISCELLANEOUS) ×1 IMPLANT
DERMABOND ADVANCED .7 DNX12 (GAUZE/BANDAGES/DRESSINGS) ×1 IMPLANT
DRAPE ARM DVNC X/XI (DISPOSABLE) ×4 IMPLANT
DRAPE COLUMN DVNC XI (DISPOSABLE) ×1 IMPLANT
DRAPE SHEET LG 3/4 BI-LAMINATE (DRAPES) ×1 IMPLANT
DRAPE SURG IRRIG POUCH 19X23 (DRAPES) ×1 IMPLANT
DRIVER NDL MEGA SUTCUT DVNCXI (INSTRUMENTS) ×1 IMPLANT
DRIVER NDLE MEGA SUTCUT DVNCXI (INSTRUMENTS) IMPLANT
DRSG OPSITE POSTOP 4X6 (GAUZE/BANDAGES/DRESSINGS) IMPLANT
DRSG OPSITE POSTOP 4X8 (GAUZE/BANDAGES/DRESSINGS) IMPLANT
DRSG TEGADERM 6X8 (GAUZE/BANDAGES/DRESSINGS) IMPLANT
ELECT PENCIL ROCKER SW 15FT (MISCELLANEOUS) IMPLANT
ELECT REM PT RETURN 15FT ADLT (MISCELLANEOUS) ×1 IMPLANT
FORCEPS BPLR FENES DVNC XI (FORCEP) ×1 IMPLANT
FORCEPS PROGRASP DVNC XI (FORCEP) ×1 IMPLANT
GAUZE 4X4 16PLY ~~LOC~~+RFID DBL (SPONGE) ×2 IMPLANT
GLOVE BIO SURGEON STRL SZ 6 (GLOVE) ×4 IMPLANT
GLOVE BIO SURGEON STRL SZ 6.5 (GLOVE) ×1 IMPLANT
GOWN STRL REUS W/ TWL LRG LVL3 (GOWN DISPOSABLE) ×4 IMPLANT
GRASPER SUT TROCAR 14GX15 (MISCELLANEOUS) IMPLANT
HEMOSTAT ARISTA ABSORB 3G PWDR (HEMOSTASIS) IMPLANT
HOLDER FOLEY CATH W/STRAP (MISCELLANEOUS) IMPLANT
IRRIG SUCT STRYKERFLOW 2 WTIP (MISCELLANEOUS) ×1 IMPLANT
IRRIGATION SUCT STRKRFLW 2 WTP (MISCELLANEOUS) ×1 IMPLANT
KIT PROCEDURE DVNC SI (MISCELLANEOUS) IMPLANT
KIT TURNOVER KIT A (KITS) IMPLANT
LIGASURE IMPACT 36 18CM CVD LR (INSTRUMENTS) IMPLANT
MANIPULATOR ADVINCU DEL 3.0 PL (MISCELLANEOUS) IMPLANT
MANIPULATOR ADVINCU DEL 3.5 PL (MISCELLANEOUS) IMPLANT
MANIPULATOR UTERINE 4.5 ZUMI (MISCELLANEOUS) IMPLANT
NDL HYPO 21X1.5 SAFETY (NEEDLE) ×1 IMPLANT
NDL SPNL 18GX3.5 QUINCKE PK (NEEDLE) IMPLANT
NEEDLE HYPO 21X1.5 SAFETY (NEEDLE) ×1 IMPLANT
NEEDLE SPNL 18GX3.5 QUINCKE PK (NEEDLE) IMPLANT
OBTURATOR OPTICAL STND 8 DVNC (TROCAR) ×1 IMPLANT
OBTURATOR OPTICALSTD 8 DVNC (TROCAR) ×1 IMPLANT
PACK ROBOT GYN CUSTOM WL (TRAY / TRAY PROCEDURE) ×1 IMPLANT
PAD POSITIONING PINK XL (MISCELLANEOUS) ×1 IMPLANT
PORT ACCESS TROCAR AIRSEAL 12 (TROCAR) IMPLANT
SCISSORS MNPLR CVD DVNC XI (INSTRUMENTS) ×1 IMPLANT
SCRUB CHG 4% DYNA-HEX 4OZ (MISCELLANEOUS) IMPLANT
SEAL UNIV 5-12 XI (MISCELLANEOUS) ×4 IMPLANT
SET TRI-LUMEN FLTR TB AIRSEAL (TUBING) ×1 IMPLANT
SOL PREP POV-IOD 4OZ 10% (MISCELLANEOUS) IMPLANT
SPIKE FLUID TRANSFER (MISCELLANEOUS) ×1 IMPLANT
SPONGE T-LAP 18X18 ~~LOC~~+RFID (SPONGE) IMPLANT
SURGIFLO W/THROMBIN 8M KIT (HEMOSTASIS) IMPLANT
SUT MNCRL AB 4-0 PS2 18 (SUTURE) IMPLANT
SUT PDS AB 1 TP1 96 (SUTURE) IMPLANT
SUT STRATA PDS 0 30 CT-2.5 (SUTURE) IMPLANT
SUT VIC AB 0 CT1 27XBRD ANTBC (SUTURE) IMPLANT
SUT VIC AB 2-0 CT1 TAPERPNT 27 (SUTURE) IMPLANT
SUT VIC AB 2-0 SH 27X BRD (SUTURE) IMPLANT
SUT VIC AB 4-0 PS2 18 (SUTURE) ×2 IMPLANT
SUT VICRYL 0 27 CT2 27 ABS (SUTURE) ×1 IMPLANT
SUT VLOC 180 0 9IN GS21 (SUTURE) IMPLANT
SYR 10ML LL (SYRINGE) IMPLANT
SYS BAG RETRIEVAL 10MM (BASKET) ×1 IMPLANT
SYS WOUND ALEXIS 18CM MED (MISCELLANEOUS) IMPLANT
SYSTEM BAG RETRIEVAL 10MM (BASKET) IMPLANT
SYSTEM WOUND ALEXIS 18CM MED (MISCELLANEOUS) IMPLANT
TRAP SPECIMEN MUCUS 40CC (MISCELLANEOUS) IMPLANT
TRAY FOLEY MTR SLVR 16FR STAT (SET/KITS/TRAYS/PACK) ×1 IMPLANT
TROCAR PORT AIRSEAL 5X120 (TROCAR) IMPLANT
TROCAR XCEL NON-BLD 5MMX100MML (ENDOMECHANICALS) IMPLANT
UNDERPAD 30X36 HEAVY ABSORB (UNDERPADS AND DIAPERS) ×2 IMPLANT
WATER STERILE IRR 1000ML POUR (IV SOLUTION) ×1 IMPLANT
YANKAUER SUCT BULB TIP 10FT TU (MISCELLANEOUS) IMPLANT

## 2024-02-02 NOTE — Op Note (Signed)
 OPERATIVE NOTE  Pre-operative Diagnosis: Adnexal mass  Post-operative Diagnosis: same, abdominal/pelvic adhesions  Operation: Robotic-assisted extensive lysis of adhesions and enterolysis (approximately 60 minutes), laparoscopic left oophorectomy, peritoneal biopsies, cystoscopy   Surgeon: Eugene Garnet MD  Assistant Surgeon: Antionette Char MD (an MD assistant was necessary for tissue manipulation, management of robotic instrumentation, retraction and positioning due to the complexity of the case and hospital policies).   Anesthesia: GET  Urine Output: 75 cc  Operative Findings: On EUA, cuff intact, no masses appreciated. On intra-abdominal inspection, normal upper abdominal survey. No ascites. Dense adhesions of the omentum to the anterior abdominal wall as well as the proximal and distal sigmoid colon. Large (6-8 cm) diastasis versus hernia noted; omentum stuck to the anterior peritoneum at this area without true hernia of the omentum through. Right adnexa surgically absent. Left ovary along the deep pelvic sidewall, adherent within the retroperitoneum and to the rectal mesentery; ovary itself 2 cm and multi-cystic (smooth). No peritoneal lesions.   Estimated Blood Loss:  75 cc      Total IV Fluids: see I&O flowsheet         Specimens: left ovary, right pelvic brim biopsy, right abdominal side wall biopsy         Complications:  None apparent; patient tolerated the procedure well.         Disposition: PACU - hemodynamically stable.  Procedure Details  The patient was seen in the Holding Room. The risks, benefits, complications, treatment options, and expected outcomes were discussed with the patient.  The patient concurred with the proposed plan, giving informed consent.  The site of surgery properly noted/marked. The patient was identified as Michele Wu and the procedure verified as a Robotic-assisted bilateral salpingo-oophorectomy with any other indicated procedures.    After induction of anesthesia, the patient was draped and prepped in the usual sterile manner. Patient was placed in supine position after anesthesia and draped and prepped in the usual sterile manner as follows: Her arms were tucked to her side with all appropriate precautions.  The patient was secured to the bed with padding and tape across her chest.  The patient was placed in the semi-lithotomy position in [place] stirrups.  The perineum and vagina were prepped with Betadine. The patient's abdomen was prepped with ChloraPrep and then she was draped after the prep had been allowed to dry for 3 minutes.  A Time Out was held and the above information confirmed.  The urethra was prepped with Betadine. Foley catheter was placed. OG tube placement was confirmed and to suction.   Next, a 10 mm skin incision was made 1 cm below the subcostal margin in the midclavicular line.  The 5 mm Optiview port and scope was used for direct entry.  Opening pressure was under 10 mm CO2.  The abdomen was insufflated and the findings were noted as above.   At this point and all points during the procedure, the patient's intra-abdominal pressure did not exceed 15 mmHg. Next, an 8 mm skin incision was made superior to the umbilicus and a right and left port were placed about 8 cm lateral to the robot port on the right and left side.  An additional 5 mm assist port was placed in the left mid abdomen.  The 5 mm assist trocar was exchanged for a 12 mm port. All ports were placed under direct visualization.  The patient was placed in some Trendelenburg.   Laparoscopically, superior aspect of the omental adhesions to the  anterior abdominal wall were lysed sharply.  Once adhesions had been mobilized sufficiently, the robot was docked in the normal manner.  Using a combination of blunt dissection and sharp dissection, 2 areas where the sigmoid colon was adherent to the right abdominal wall or freed.  Attention was then turned to the  omentum, which was quite adherent to the anterior abdominal wall along the midline where it appeared that there was either a very shallow hernia versus rectus diastases.  The anterior fascia appeared intact.  Bipolar monopolar electrocautery were used to lyse adhesions of the omentum to this defect.  This helped identify that additional proximal sigmoid was adherent to the left anterior abdominal wall.  Sharp and blunt dissection were used to mobilize the sigmoid colon.  Attention was then turned to the left peritoneum.  The peritoneum was opened quite laterally parallel to what was presumed to be the location of the IP ligament.  The colon was also mobilized along the white line of [place].  The left retroperitoneum was developed and the infundibulopelvic ligament was ultimately identified.  Deep within the pelvis, what appeared to be a multicystic left ovary was identified, somewhat adherent to the left pelvic sidewall as well as to the rectal mesentery.  The remanent round ligament was identified and ultimately required transection for removal of the left ovary.  The infundibulopelvic ligament was skeletonized, cauterized, and transected after visualizing the ureter inferiorly along the medial leaf of the broad ligament.  With upward traction on the distal end of the IP ligament, the left ovary was meticulously dissected free from surrounding structures using combination of blunt dissection, sharp dissection, and short bursts of monopolar electrocautery.  Once completely mobilized, the left adnexa was placed in an Endo Catch bag.  Attention was then turned to the right, the right pelvic brim was identified and no residual adnexal tissue noted.  Biopsy was taken from what appeared to be the scar from where the infundibulopelvic ligament had been taken.  An additional right abdominal wall peritoneal biopsy was taken where there appeared to be some hemosiderin effect.  Irrigation was used and excellent hemostasis  was achieved.  Arista was placed along the left pelvic sidewall. At this point in the procedure was completed.  Robotic instruments were removed under direct visulaization.  The robot was undocked.   The bladder was backfilled with 200 cc of sterile fluid, foley catheter removed, and cystoscopy was performed with findings noted above.   The fascia at the 12 mm port was closed with 0 Vicryl on a UR-5 needle.  The subcuticular tissue was closed with 4-0 Vicryl and the skin was closed with 4-0 Monocryl in a subcuticular manner.  Dermabond was applied.    All sponge, lap and needle counts were correct x  3.   The patient was transferred to the recovery room in stable condition.  Eugene Garnet, MD

## 2024-02-02 NOTE — Discharge Instructions (Addendum)
 AFTER SURGERY INSTRUCTIONS   Return to work: 4-6 weeks if applicable   Since you will be having your remaining ovary removed, Dr. Pricilla Holm will talk with you about hormone replacement after surgery.   Activity: 1. Be up and out of the bed during the day.  Take a nap if needed.  You may walk up steps but be careful and use the hand rail.  Stair climbing will tire you more than you think, you may need to stop part way and rest.    2. No lifting or straining for 6 weeks over 10 pounds. No pushing, pulling, straining for 6 weeks.   3. No driving for 1-61 days when the following criteria have been met: Do not drive if you are taking narcotic pain medicine and make sure that your reaction time has returned.    4. You can shower as soon as the next day after surgery. Shower daily.  Use your regular soap and water (not directly on the incision) and pat your incision(s) dry afterwards; don't rub.  No tub baths or submerging your body in water until cleared by your surgeon. If you have the soap that was given to you by pre-surgical testing that was used before surgery, you do not need to use it afterwards because this can irritate your incisions.    5. No sexual activity and nothing in the vagina for 4 weeks.   6. You may experience a small amount of clear drainage from your incisions, which is normal.  If the drainage persists, increases, or changes color please call the office.   7. Do not use creams, lotions, or ointments such as neosporin on your incisions after surgery until advised by your surgeon because they can cause removal of the dermabond glue on your incisions.     8. Take Tylenol or ibuprofen first for pain if you are able to take these medications and only use narcotic pain medication for severe pain not relieved by the Tylenol or Ibuprofen.  Monitor your Tylenol intake to a max of 4,000 mg in a 24 hour period. You can alternate these medications after surgery.   Diet: 1. Low sodium Heart  Healthy Diet is recommended but you are cleared to resume your normal (before surgery) diet after your procedure.   2. It is safe to use a laxative, such as Miralax or Colace, if you have difficulty moving your bowels before surgery. You have been prescribed Sennakot-S to take at bedtime every evening after surgery to keep bowel movements regular and to prevent constipation.     Wound Care: 1. Keep clean and dry.  Shower daily.   Reasons to call the Doctor: Fever - Oral temperature greater than 100.4 degrees Fahrenheit Foul-smelling vaginal discharge Difficulty urinating Nausea and vomiting Increased pain at the site of the incision that is unrelieved with pain medicine. Difficulty breathing with or without chest pain New calf pain especially if only on one side Sudden, continuing increased vaginal bleeding with or without clots.   Contacts: For questions or concerns you should contact:   Dr. Eugene Garnet at [phone]   Warner Mccreedy, NP at [phone]   After Hours: call [phone] and have the GYN Oncologist paged/contacted (after 5 pm or on the weekends). You will speak with an after hours RN and let he or she know you have had surgery.   Messages sent via mychart are for non-urgent matters and are not responded to after hours so for urgent needs, please call the  after hours number.

## 2024-02-02 NOTE — H&P (Signed)
 Gynecologic Oncology H&P  02/02/24  Treatment History: Patient developed irregular menses in December 2022 with abnormal uterine bleeding.  Would miss several months between menses, when she would bleeding, menses often lasted much longer and were heavier.   Pelvic ultrasound on 12/05/2021 revealed uterus measuring 7.9 x 3.3 x 5.3 cm.  Endometrial lining 4 mm.  Right ovary measures up to 3 cm and is normal in appearance.  Left ovary measures up to 3.4 cm.  Complicated cyst within the left ovary measures up to 3.3 cm containing multiple thin septations, no discrete mural nodules.  Complicated cystic lesion identified in the right adnexa, superior to the right ovary measuring up to 4.2 cm with an avascular, intracystic mural nodule measuring 14 x 6 x 11 mm.   Repeat pelvic ultrasound exam on 02/10/2022 reveals right ovary measures up to 2.6 cm and is normal in appearance.  There is a 4.1 x 3.1 x 3.8 cm complex right adnexal cyst with known vascular nodule measuring up to 1 cm.  Left ovary measures up to 3.1 cm with a septated complex left ovarian cyst measuring 2.3 x 1.8 x 1.1 cm.   In 02/2022, ROMA low risk. CA-125 was 30.7.   On 6/28, the patient underwent TAH/BS, right ovarian cystectomy and oophorectomy.  Findings at the time of surgery were a normal uterus and bilateral fallopian tubes. 4cm paratubal right ovarian cyst.  Normal left ovary.   CA-125 on 7/5 was 128.   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 Cytology was negative.    CT A/P on 06/12/2022 revealed no evidence of adenopathy or metastatic disease.  Postsurgical changes noted.   CA-125 on 09/08/22: 9.6.  Interval History: Doing well.  Past Medical/Surgical History: Past Medical History:  Diagnosis Date   Anxiety    Chest pain    Complication of anesthesia    Depression    Dysuria    GERD (gastroesophageal reflux disease)    Headache    High cholesterol    Hypertension    Low  back pain    PONV (postoperative nausea and vomiting)     Past Surgical History:  Procedure Laterality Date   ABDOMINAL HYSTERECTOMY N/A 04/22/2022   Procedure: HYSTERECTOMY ABDOMINAL;  Surgeon: Myna Hidalgo, DO;  Location: [facility];  Service: Gynecology;  Laterality: N/A;   BILATERAL SALPINGECTOMY N/A 04/22/2022   Procedure: OPEN BILATERAL SALPINGECTOMY;  Surgeon: Myna Hidalgo, DO;  Location: [facility];  Service: Gynecology;  Laterality: N/A;   CESAREAN SECTION     x2   CHOLECYSTECTOMY     OOPHORECTOMY Right 04/22/2022   Procedure: RIGHT OOPHORECTOMY AND RIGHT OVARIAN CYST REMOVAL;  Surgeon: Myna Hidalgo, DO;  Location: [facility];  Service: Gynecology;  Laterality: Right;    Family History  Adopted: Yes  Family history unknown: Yes    Social History   Socioeconomic History   Marital status: Married    Spouse name: Not on file   Number of children: 2   Years of education: Not on file   Highest education level: Not on file  Occupational History   Occupation: home health care   Occupation: caregiver    Comment: [employer]  Tobacco Use   Smoking status: Never   Smokeless tobacco: Never  Vaping Use   Vaping status: Never Used  Substance and Sexual Activity   Alcohol use: Never   Drug use: Never   Sexual activity: Yes    Birth control/protection:  Surgical    Comment: tubal/hyst  Other Topics Concern   Not on file  Social History Narrative   Not on file   Social Drivers of Health   Financial Resource Strain: Low Risk  (07/29/2023)   Overall Financial Resource Strain (CARDIA)    Difficulty of Paying Living Expenses: Not very hard  Food Insecurity: Food Insecurity Present (07/29/2023)   Hunger Vital Sign    Worried About Running Out of Food in the Last Year: Often true    Ran Out of Food in the Last Year: Often true  Transportation Needs: No Transportation Needs (07/29/2023)   PRAPARE - Administrator, Civil Service (Medical): No    Lack of  Transportation (Non-Medical): No  Physical Activity: Insufficiently Active (07/29/2023)   Exercise Vital Sign    Days of Exercise per Week: 5 days    Minutes of Exercise per Session: 20 min  Stress: Stress Concern Present (07/29/2023)   [employer] of Occupational Health - Occupational Stress Questionnaire    Feeling of Stress : Very much  Social Connections: Moderately Integrated (07/29/2023)   Social Connection and Isolation Panel [NHANES]    Frequency of Communication with Friends and Family: More than three times a week    Frequency of Social Gatherings with Friends and Family: Once a week    Attends Religious Services: More than 4 times per year    Active Member of [employer] or Organizations: No    Attends Engineer, structural: Never    Marital Status: Married    Current Medications:  Current Facility-Administered Medications:    acetaminophen (TYLENOL) tablet 1,000 mg, 1,000 mg, Oral, On Call to OR, Cross, Melissa D, NP   chlorhexidine (PERIDEX) 0.12 % solution 15 mL, 15 mL, Mouth/Throat, Once **OR** Oral care mouth rinse, 15 mL, Mouth Rinse, Once, Lewie Loron, MD   dexamethasone (DECADRON) injection 4 mg, 4 mg, Intravenous, On Call to OR, Cross, Melissa D, NP   gabapentin (NEURONTIN) capsule 300 mg, 300 mg, Oral, On Call to OR, Cross, Melissa D, NP   heparin injection 5,000 Units, 5,000 Units, Subcutaneous, 120 min pre-op, Cross, Melissa D, NP   lactated ringers infusion, , Intravenous, Continuous, [place]er[place], John, MD   scopolamine (TRANSDERM-SCOP) 1 MG/3DAYS 1.5 mg, 1 patch, Transdermal, On Call to OR, Cross, Melissa D, NP  Review of Systems: + Fatigue, ringing in ears, dyspareunia, frequency, discharge, joint pain, headache, depression Denies appetite changes, fevers, chills, unexplained weight changes. Denies hearing loss, neck lumps or masses, mouth sores or voice changes. Denies cough or wheezing.  Denies shortness of breath. Denies chest pain or  palpitations. Denies leg swelling. Denies abdominal distention, pain, blood in stools, constipation, diarrhea, nausea, vomiting, or early satiety. Denies dysuria, hematuria or incontinence. Denies hot flashes, pelvic pain, vaginal bleeding.   Denies back pain or muscle pain/cramps. Denies itching, rash, or wounds. Denies dizziness, numbness or seizures. Denies swollen lymph nodes or glands, denies easy bruising or bleeding. Denies anxiety, confusion, or decreased concentration.  Physical Exam: Ht 5\' 1"  (1.549 m)   Wt 277 lb (125.6 kg)   LMP 03/09/2022 (Exact Date) Comment: hcg negative 04/20/2022  BMI 52.34 kg/m  General: Alert, oriented, no acute distress. HEENT: Atraumatic, normocephalic, sclera anicteric. Chest: Clear to auscultation bilaterally.  No wheezes or rhonchi. Cardiovascular: Regular rate and rhythm, no murmurs. Abdomen: Obese, soft, nontender.  Normoactive bowel sounds.  No masses or hepatosplenomegaly appreciated.   Extremities: Grossly normal range of motion.  Warm, well perfused.  No edema bilaterally  Laboratory & Radiologic Studies:    Latest Ref Rng & Units 01/26/2024    2:23 PM 07/13/2023    3:16 PM 04/23/2022    5:48 AM  CBC  WBC 4.0 - 10.5 K/uL 6.5  5.8  12.7   Hemoglobin 12.0 - 15.0 g/dL 16.1  09.6  9.2   Hematocrit 36.0 - 46.0 % 39.5  37.4  27.8   Platelets 150 - 400 K/uL 264  246  359       Latest Ref Rng & Units 01/26/2024    2:23 PM 07/13/2023    3:16 PM 04/23/2022    5:48 AM  BMP  Glucose 70 - 99 mg/dL 045  94  409   BUN 6 - 20 mg/dL 17  18  14    Creatinine 0.44 - 1.00 mg/dL 8.11  9.14  7.82   BUN/Creat Ratio 9 - 23  20    Sodium 135 - 145 mmol/L 138  141  133   Potassium 3.5 - 5.1 mmol/L 3.5  4.2  4.4   Chloride 98 - 111 mmol/L 105  103  102   CO2 22 - 32 mmol/L 24  23  24    Calcium 8.9 - 10.3 mg/dL 9.1  9.1  8.2    Pelvic ultrasound 12/13/23: FINDINGS: The uterus and right ovary are surgically absent.   The left ovary measures 3.0 x 2.0 x  1.8 cm and demonstrates a dominant follicle measuring 2.2 cm. There is normal color Doppler flow.   There is no fluid present within the cul-de-sac.   IMPRESSION: 1. Surgically absent uterus and right ovary.   2.  Unremarkable ultrasound of the left ovary.   Thank you for allowing [name] to assist in the care of this patient.  Assessment & Plan: Michele Wu is a 43 y.o. 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    Plan for completion surgery today with USO, possible peritoneal biopsies and omental biopsy.   Given her urinary symptoms, discussed cystoscopy today.   The risks of surgery were discussed in detail and she understands these to include infection; wound separation; hernia; injury to adjacent organs such as bowel, bladder, blood vessels, ureters and nerves; bleeding which may require blood transfusion; anesthesia risk; thromboembolic events; possible death; unforeseen complications; possible need for re-exploration; medical complications such as heart attack, stroke, pleural effusion and pneumonia. The patient will receive DVT and antibiotic prophylaxis as indicated. She voiced a clear understanding. She had the opportunity to ask questions.   Eugene Garnet, MD  Division of Gynecologic Oncology  Department of Obstetrics and Gynecology  [facility]

## 2024-02-02 NOTE — Transfer of Care (Signed)
 Immediate Anesthesia Transfer of Care Note  Patient: Michele Wu  Procedure(s) Performed: XI ROBOTIC ASSISTED LEFT  OOPHORECTOMY,EXTENSIVE LYSIS OF ADHESIONS ENTEROLISIS, PERITONEAL BIOPSIES, CYSTOSCPY (Left)  Patient Location: PACU  Anesthesia Type:General  Level of Consciousness: awake, alert , and patient cooperative  Airway & Oxygen Therapy: Patient Spontanous Breathing and Patient connected to face mask oxygen  Post-op Assessment: Report given to RN and Post -op Vital signs reviewed and stable  Post vital signs: Reviewed and stable  Last Vitals:  Vitals Value Taken Time  BP 145/77 02/02/24 1215  Temp    Pulse 66 02/02/24 1216  Resp 20 02/02/24 1216  SpO2 100 % 02/02/24 1216  Vitals shown include unfiled device data.  Last Pain:  Vitals:   02/02/24 0817  TempSrc: Oral         Complications: No notable events documented.

## 2024-02-02 NOTE — Anesthesia Preprocedure Evaluation (Addendum)
 Anesthesia Evaluation  Patient identified by MRN, date of birth, ID band Patient awake    Reviewed: Allergy & Precautions, NPO status , Patient's Chart, lab work & pertinent test results  History of Anesthesia Complications (+) PONV and history of anesthetic complications  Airway Mallampati: I  TM Distance: <3 FB Neck ROM: Full    Dental  (+) Edentulous Upper, Edentulous Lower   Pulmonary neg pulmonary ROS   breath sounds clear to auscultation       Cardiovascular hypertension, Pt. on home beta blockers and Pt. on medications  Rhythm:Regular Rate:Normal     Neuro/Psych  Headaches    GI/Hepatic Neg liver ROS,GERD  Medicated,,  Endo/Other    Class 4 obesity  Renal/GU negative Renal ROS     Musculoskeletal negative musculoskeletal ROS (+)    Abdominal  (+) + obese  Peds  Hematology negative hematology ROS (+)   Anesthesia Other Findings   Reproductive/Obstetrics                             Anesthesia Physical Anesthesia Plan  ASA: 3  Anesthesia Plan: General   Post-op Pain Management: Tylenol PO (pre-op)* and Toradol IV (intra-op)*   Induction: Intravenous  PONV Risk Score and Plan: 4 or greater and Ondansetron, Dexamethasone, Scopolamine patch - Pre-op and Midazolam  Airway Management Planned: Oral ETT  Additional Equipment: None  Intra-op Plan:   Post-operative Plan: Extubation in OR  Informed Consent: I have reviewed the patients History and Physical, chart, labs and discussed the procedure including the risks, benefits and alternatives for the proposed anesthesia with the patient or authorized representative who has indicated his/her understanding and acceptance.     Dental advisory given  Plan Discussed with: CRNA  Anesthesia Plan Comments: (- 2 IV's)       Anesthesia Quick Evaluation

## 2024-02-02 NOTE — Anesthesia Procedure Notes (Signed)
 Procedure Name: Intubation Date/Time: 02/02/2024 9:59 AM  Performed by: Sindy Guadeloupe, CRNAPre-anesthesia Checklist: Patient identified, Emergency Drugs available, Suction available, Patient being monitored and Timeout performed Patient Re-evaluated:Patient Re-evaluated prior to induction Oxygen Delivery Method: Circle system utilized Preoxygenation: Pre-oxygenation with 100% oxygen Induction Type: IV induction Ventilation: Mask ventilation without difficulty Laryngoscope Size: Mac and 4 Grade View: Grade I Tube type: Oral Tube size: 7.0 mm Number of attempts: 1 Airway Equipment and Method: Stylet Placement Confirmation: ETT inserted through vocal cords under direct vision, positive ETCO2 and breath sounds checked- equal and bilateral Secured at: 21 cm Tube secured with: Tape Dental Injury: Teeth and Oropharynx as per pre-operative assessment

## 2024-02-02 NOTE — Anesthesia Postprocedure Evaluation (Signed)
 Anesthesia Post Note  Patient: Michele Wu  Procedure(s) Performed: XI ROBOTIC ASSISTED LEFT  OOPHORECTOMY,EXTENSIVE LYSIS OF ADHESIONS ENTEROLISIS, PERITONEAL BIOPSIES, CYSTOSCPY (Left)     Patient location during evaluation: PACU Anesthesia Type: General Level of consciousness: awake and alert Pain management: pain level controlled Vital Signs Assessment: post-procedure vital signs reviewed and stable Respiratory status: spontaneous breathing, nonlabored ventilation, respiratory function stable and patient connected to nasal cannula oxygen Cardiovascular status: blood pressure returned to baseline and stable Postop Assessment: no apparent nausea or vomiting Anesthetic complications: no  No notable events documented.  Last Vitals:  Vitals:   02/02/24 1355 02/02/24 1400  BP: (!) 153/75 (!) 140/96  Pulse: 73 71  Resp:  14  Temp:    SpO2: 95% 94%    Last Pain:  Vitals:   02/02/24 1400  TempSrc:   PainSc: 5                  Shelton Silvas

## 2024-02-02 NOTE — Brief Op Note (Signed)
 02/02/2024  12:05 PM  PATIENT:  Michele Wu  43 y.o. female  PRE-OPERATIVE DIAGNOSIS:  HISTORY OF BORDERLINE TUMOR OF PARATUBAL CYST  POST-OPERATIVE DIAGNOSIS:  HISTORY OF BORDERLINE TUMOR OF PARATUBAL CYST  PROCEDURE:  Procedure(s): XI ROBOTIC ASSISTED LEFT  OOPHORECTOMY,EXTENSIVE LYSIS OF ADHESIONS ENTEROLISIS, PERITONEAL BIOPSIES, CYSTOSCPY (Left)  SURGEON:  Surgeons and Role:    Carver Fila, MD - Primary    * Antionette Char, MD - Assisting  ANESTHESIA:   general  EBL:  75 mL   BLOOD ADMINISTERED:none  DRAINS: none   LOCAL MEDICATIONS USED:  MARCAINE     SPECIMEN:  left ovary, right pelvic and abdominal side wall biopsies  DISPOSITION OF SPECIMEN:  PATHOLOGY  COUNTS:  YES  TOURNIQUET:  * No tourniquets in log *  DICTATION: .Note written in EPIC  PLAN OF CARE: Admit for overnight observation  PATIENT DISPOSITION:  PACU - hemodynamically stable.   Delay start of Pharmacological VTE agent (>24hrs) due to surgical blood loss or risk of bleeding: no

## 2024-02-03 ENCOUNTER — Encounter: Payer: Self-pay | Admitting: Gynecologic Oncology

## 2024-02-03 ENCOUNTER — Encounter (HOSPITAL_COMMUNITY): Payer: Self-pay | Admitting: Gynecologic Oncology

## 2024-02-03 ENCOUNTER — Telehealth: Payer: Self-pay | Admitting: *Deleted

## 2024-02-03 LAB — SURGICAL PATHOLOGY

## 2024-02-03 NOTE — Telephone Encounter (Signed)
 Spoke with Michele Wu this morning. She states she is eating, drinking and urinating well. She has not had a BM yet but is passing gas. She is taking senokot as prescribed and encouraged her to drink plenty of water. She denies fever or chills. Incisions are dry and intact. She rates her pain 5/10. Her pain is controlled with Norco. Pt is having gas pains as well and encouraged pt to try alternating heat and ice on areas of upper incision, walk to help with gas and try hot tea and over the counter mylicon chews after each meal and at bedtime.     Instructed to call office with any fever, chills, purulent drainage, uncontrolled pain or any other questions or concerns. Patient verbalizes understanding.   Pt aware of post op appointments as well as the office number [phone] and after hours number [phone] to call if she has any questions or concerns

## 2024-02-25 ENCOUNTER — Encounter: Admitting: Gynecologic Oncology

## 2024-03-03 ENCOUNTER — Inpatient Hospital Stay: Attending: Gynecologic Oncology | Admitting: Gynecologic Oncology

## 2024-03-03 ENCOUNTER — Encounter: Payer: Self-pay | Admitting: Gynecologic Oncology

## 2024-03-03 VITALS — BP 130/81 | HR 67 | Temp 97.8°F | Resp 17 | Ht 61.0 in | Wt 284.4 lb

## 2024-03-03 DIAGNOSIS — D4959 Neoplasm of unspecified behavior of other genitourinary organ: Secondary | ICD-10-CM

## 2024-03-03 DIAGNOSIS — D3911 Neoplasm of uncertain behavior of right ovary: Secondary | ICD-10-CM

## 2024-03-03 DIAGNOSIS — E8940 Asymptomatic postprocedural ovarian failure: Secondary | ICD-10-CM

## 2024-03-03 DIAGNOSIS — Z7189 Other specified counseling: Secondary | ICD-10-CM

## 2024-03-03 DIAGNOSIS — Z90721 Acquired absence of ovaries, unilateral: Secondary | ICD-10-CM

## 2024-03-03 MED ORDER — ESTRADIOL 0.05 MG/24HR TD PTTW: 1.0000 | MEDICATED_PATCH | TRANSDERMAL | 12 refills | Status: AC

## 2024-03-03 NOTE — Progress Notes (Signed)
 Gynecologic Oncology Return Clinic Visit  03/03/24  Reason for Visit: follow-up  Treatment History: Patient developed irregular menses in December 2022 with abnormal uterine bleeding.  Would miss several months between menses, when she would bleeding, menses often lasted much longer and were heavier.   Pelvic ultrasound on 12/05/2021 revealed uterus measuring 7.9 x 3.3 x 5.3 cm.  Endometrial lining 4 mm.  Right ovary measures up to 3 cm and is normal in appearance.  Left ovary measures up to 3.4 cm.  Complicated cyst within the left ovary measures up to 3.3 cm containing multiple thin septations, no discrete mural nodules.  Complicated cystic lesion identified in the right adnexa, superior to the right ovary measuring up to 4.2 cm with an avascular, intracystic mural nodule measuring 14 x 6 x 11 mm.   Repeat pelvic ultrasound exam on 02/10/2022 reveals right ovary measures up to 2.6 cm and is normal in appearance.  There is a 4.1 x 3.1 x 3.8 cm complex right adnexal cyst with known vascular nodule measuring up to 1 cm.  Left ovary measures up to 3.1 cm with a septated complex left ovarian cyst measuring 2.3 x 1.8 x 1.1 cm.   In 02/2022, ROMA low risk. CA-125 was 30.7.   On 6/28, the patient underwent TAH/BS, right ovarian cystectomy and oophorectomy.  Findings at the time of surgery were a normal uterus and bilateral fallopian tubes. 4cm paratubal right ovarian cyst.  Normal left ovary.   CA-125 on 7/5 was 128.   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 Cytology was negative.    CT A/P on 06/12/2022 revealed no evidence of adenopathy or metastatic disease.  Postsurgical changes noted.   CA-125 on 09/08/22: 9.6.  CA-125 on 11/12/23: 9.6.  02/02/24: Robotic-assisted extensive lysis of adhesions and enterolysis (approximately 60 minutes), laparoscopic left oophorectomy, peritoneal biopsies, cystoscopy   Interval History: Doing well.  Reports baseline  bowel bladder function.  Denies any abdominal pain.  Past Medical/Surgical History: Past Medical History:  Diagnosis Date   Anxiety    Chest pain    Complication of anesthesia    Depression    Dysuria    GERD (gastroesophageal reflux disease)    Headache    High cholesterol    Hypertension    Low back pain    PONV (postoperative nausea and vomiting)     Past Surgical History:  Procedure Laterality Date   ABDOMINAL HYSTERECTOMY N/A 04/22/2022   Procedure: HYSTERECTOMY ABDOMINAL;  Surgeon: Keene Pastures, DO;  Location: [facility];  Service: Gynecology;  Laterality: N/A;   BILATERAL SALPINGECTOMY N/A 04/22/2022   Procedure: OPEN BILATERAL SALPINGECTOMY;  Surgeon: Keene Pastures, DO;  Location: [facility];  Service: Gynecology;  Laterality: N/A;   CESAREAN SECTION     x2   CHOLECYSTECTOMY     OOPHORECTOMY Right 04/22/2022   Procedure: RIGHT OOPHORECTOMY AND RIGHT OVARIAN CYST REMOVAL;  Surgeon: Ozan, Jennifer, DO;  Location: [facility];  Service: Gynecology;  Laterality: Right;   XI ROBOTIC ASSISTED OOPHORECTOMY Left 02/02/2024   Procedure: XI ROBOTIC ASSISTED LEFT  OOPHORECTOMY,EXTENSIVE LYSIS OF ADHESIONS ENTEROLISIS, PERITONEAL BIOPSIES, CYSTOSCPY;  Surgeon: Suzi Essex, MD;  Location: [facility];  Service: Gynecology;  Laterality: Left;    Family History  Adopted: Yes  Family history unknown: Yes    Social History   Socioeconomic History   Marital status: Married    Spouse name: Not on file   Number of children: 2  Years of education: Not on file   Highest education level: Not on file  Occupational History   Occupation: home health care   Occupation: caregiver    Comment: [employer]  Tobacco Use   Smoking status: Never   Smokeless tobacco: Never  Vaping Use   Vaping status: Never Used  Substance and Sexual Activity   Alcohol use: Never   Drug use: Never   Sexual activity: Yes    Birth control/protection: Surgical    Comment: tubal/hyst  Other Topics  Concern   Not on file  Social History Narrative   Not on file   Social Drivers of Health   Financial Resource Strain: Low Risk  (07/29/2023)   Overall Financial Resource Strain (CARDIA)    Difficulty of Paying Living Expenses: Not very hard  Food Insecurity: Food Insecurity Present (07/29/2023)   Hunger Vital Sign    Worried About Running Out of Food in the Last Year: Often true    Ran Out of Food in the Last Year: Often true  Transportation Needs: No Transportation Needs (07/29/2023)   PRAPARE - Administrator, Civil Service (Medical): No    Lack of Transportation (Non-Medical): No  Physical Activity: Insufficiently Active (07/29/2023)   Exercise Vital Sign    Days of Exercise per Week: 5 days    Minutes of Exercise per Session: 20 min  Stress: Stress Concern Present (07/29/2023)   [employer] of Occupational Health - Occupational Stress Questionnaire    Feeling of Stress : Very much  Social Connections: Moderately Integrated (07/29/2023)   Social Connection and Isolation Panel [NHANES]    Frequency of Communication with Friends and Family: More than three times a week    Frequency of Social Gatherings with Friends and Family: Once a week    Attends Religious Services: More than 4 times per year    Active Member of [employer] or Organizations: No    Attends Banker Meetings: Never    Marital Status: Married    Current Medications:  Current Outpatient Medications:    ALPRAZolam (XANAX) 1 MG tablet, Take 1 mg by mouth daily as needed for anxiety., Disp: , Rfl:    [START ON 03/06/2024] estradiol  (VIVELLE -DOT) 0.05 MG/24HR patch, Place 1 patch (0.05 mg total) onto the skin 2 (two) times a week., Disp: 8 patch, Rfl: 12   losartan -hydrochlorothiazide (HYZAAR) 100-12.5 MG tablet, Take 1 tablet by mouth daily., Disp: , Rfl:    metoprolol  succinate (TOPROL -XL) 100 MG 24 hr tablet, Take 100 mg by mouth daily. Take with or immediately following a meal., Disp: , Rfl:     omeprazole  (PRILOSEC) 20 MG capsule, Take 20 mg by mouth daily., Disp: , Rfl:    simvastatin (ZOCOR) 40 MG tablet, Take 40 mg by mouth at bedtime., Disp: , Rfl:   Review of Systems: + itching, hot flashes Denies appetite changes, fevers, chills, fatigue, unexplained weight changes. Denies hearing loss, neck lumps or masses, mouth sores, ringing in ears or voice changes. Denies cough or wheezing.  Denies shortness of breath. Denies chest pain or palpitations. Denies leg swelling. Denies abdominal distention, pain, blood in stools, constipation, diarrhea, nausea, vomiting, or early satiety. Denies pain with intercourse, dysuria, frequency, hematuria or incontinence. Denies pelvic pain, vaginal bleeding or vaginal discharge.   Denies joint pain, back pain or muscle pain/cramps. Denies rash, or wounds. Denies dizziness, headaches, numbness or seizures. Denies swollen lymph nodes or glands, denies easy bruising or bleeding. Denies anxiety, depression,  confusion, or decreased concentration.  Physical Exam: BP 130/81 (BP Location: Left Arm, Patient Position: Sitting)   Pulse 67   Temp 97.8 F (36.6 C) (Oral)   Resp 17   Ht 5\' 1"  (1.549 m)   Wt 284 lb 6.4 oz (129 kg)   LMP 03/09/2022 (Exact Date) Comment: hcg negative 04/20/2022  SpO2 100%   BMI 53.74 kg/m  General: Alert, oriented, no acute distress. HEENT: Posterior oropharynx clear, sclera anicteric. Chest: Unlabored breathing on room air. Abdomen: Obese, soft, nontender.  Normoactive bowel sounds.  No masses or hepatosplenomegaly appreciated.  Well-healed incisions. Extremities: Grossly normal range of motion.  Warm, well perfused.  No edema bilaterally.  Laboratory & Radiologic Studies: A. PELVIC SIDEWALL, RIGHT, BIOPSY:  Benign peritoneum and connective tissue.  Negative for endometriosis, borderline tumor and malignancy.   B. ABDOMINAL SIDEWALL, RIGHT, BIOPSY:  Connective tissue with abundant hemosiderin-laden macrophages  and scant  stroma suggestive of endometriosis.  Negative for borderline tumor and malignancy.   C. OVARY, LEFT:  Serous cystadenofibroma.  Benign follicular cysts  Suture material with fibrotic response consistent with previous  procedure.  Negative for endometriosis, borderline tumor and malignancy.   Assessment & Plan: TINE MUCKLOW is a 43 y.o. 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  Now status post completion surgery with benign pathology.  Patient is doing well, discussed continued restrictions and postoperative expectations.  Discussed her pruritus, which is diffuse (full body).  Suggested that she start using the lotion, recommended either Cetaphil or Aquaphor.  Patient is now having hot flashes.  Given premenopausal age, discussed option of hormone replacement therapy with her.  Reviewed risks and benefits of estrogen replacement therapy.  In the setting of premenopause, there is benefit to estrogen replacement therapy with regard to bone health, brain health, and heart health.  Small risk of increased VTE has been shown in postmenopausal patients (different patient population).  Reviewed that in this population, patch seems to decrease this risk given bypass of first-pass metabolism.  Sent prescription to her pharmacy.  I asked her to reach out if she does not have improvement in her hot flashes or there is any difficulty with patch adhesive.  She was diagnosed with a borderline paratubal cyst in 2023, now status post completion surgery.  CA125 was at the upper limit of normal prior to surgery.  Discussed recommendation for yearly follow-up with her OB/GYN.  We reviewed signs and symptoms that would be concerning for recurrence of a borderline tumor.  22 minutes of total time was spent for this patient encounter, including preparation, face-to-face counseling with the patient and coordination of care, and documentation of the encounter.  Wiley Hanger, MD   Division of Gynecologic Oncology  Department of Obstetrics and Gynecology  [facility]

## 2024-03-03 NOTE — Patient Instructions (Addendum)
 It was good to see you today!  You are healing well from surgery.  Please remember, no heavy lifting for 6 weeks.  I am sending her scription for estrogen replacement to your pharmacy.  Please let me know if you continue to have hot flashes or other symptoms after starting this.  Please also let me know if there is any issues with patch sticking to your skin.

## 2024-06-06 ENCOUNTER — Telehealth: Payer: Self-pay | Admitting: Neurology

## 2024-06-06 NOTE — Telephone Encounter (Signed)
 [facility] conf

## 2024-06-12 ENCOUNTER — Ambulatory Visit: Payer: Medicaid Other | Admitting: Neurology

## 2024-06-12 ENCOUNTER — Encounter: Payer: Self-pay | Admitting: Neurology

## 2024-08-03 ENCOUNTER — Encounter (HOSPITAL_BASED_OUTPATIENT_CLINIC_OR_DEPARTMENT_OTHER): Payer: Self-pay | Admitting: Internal Medicine

## 2024-08-03 DIAGNOSIS — R5383 Other fatigue: Secondary | ICD-10-CM

## 2024-08-03 DIAGNOSIS — R0681 Apnea, not elsewhere classified: Secondary | ICD-10-CM

## 2024-08-03 DIAGNOSIS — R0683 Snoring: Secondary | ICD-10-CM
